# Patient Record
Sex: Female | Born: 1970 | Hispanic: No | Marital: Single | Smoking: Current every day smoker
Health system: Southern US, Community
[De-identification: ages and names within clinical notes are randomized; demographics above are authoritative.]

## PROBLEM LIST (undated history)

## (undated) DIAGNOSIS — Z720 Tobacco use: Secondary | ICD-10-CM

## (undated) DIAGNOSIS — D649 Anemia, unspecified: Secondary | ICD-10-CM

## (undated) DIAGNOSIS — K589 Irritable bowel syndrome without diarrhea: Secondary | ICD-10-CM

## (undated) DIAGNOSIS — M549 Dorsalgia, unspecified: Secondary | ICD-10-CM

## (undated) DIAGNOSIS — A048 Other specified bacterial intestinal infections: Secondary | ICD-10-CM

## (undated) DIAGNOSIS — K219 Gastro-esophageal reflux disease without esophagitis: Secondary | ICD-10-CM

## (undated) HISTORY — DX: Gastro-esophageal reflux disease without esophagitis: K21.9

## (undated) HISTORY — DX: Anemia, unspecified: D64.9

## (undated) HISTORY — DX: Tobacco use: Z72.0

## (undated) HISTORY — DX: Dorsalgia, unspecified: M54.9

## (undated) HISTORY — DX: Other specified bacterial intestinal infections: A04.8

## (undated) HISTORY — DX: Irritable bowel syndrome without diarrhea: K58.9

---

## 1996-06-21 HISTORY — PX: TUBAL LIGATION: SHX77

## 2000-10-06 ENCOUNTER — Emergency Department (HOSPITAL_COMMUNITY): Admission: EM | Admit: 2000-10-06 | Discharge: 2000-10-06 | Payer: Self-pay | Admitting: Emergency Medicine

## 2001-05-31 ENCOUNTER — Emergency Department (HOSPITAL_COMMUNITY): Admission: EM | Admit: 2001-05-31 | Discharge: 2001-05-31 | Payer: Self-pay | Admitting: Emergency Medicine

## 2001-06-01 ENCOUNTER — Emergency Department (HOSPITAL_COMMUNITY): Admission: EM | Admit: 2001-06-01 | Discharge: 2001-06-01 | Payer: Self-pay | Admitting: Emergency Medicine

## 2001-10-04 ENCOUNTER — Encounter: Payer: Self-pay | Admitting: Emergency Medicine

## 2001-10-05 ENCOUNTER — Inpatient Hospital Stay (HOSPITAL_COMMUNITY): Admission: EM | Admit: 2001-10-05 | Discharge: 2001-10-06 | Payer: Self-pay | Admitting: Emergency Medicine

## 2002-06-21 DIAGNOSIS — Z720 Tobacco use: Secondary | ICD-10-CM

## 2002-06-21 HISTORY — DX: Tobacco use: Z72.0

## 2003-07-11 ENCOUNTER — Emergency Department (HOSPITAL_COMMUNITY): Admission: EM | Admit: 2003-07-11 | Discharge: 2003-07-11 | Payer: Self-pay | Admitting: Emergency Medicine

## 2003-11-15 ENCOUNTER — Emergency Department (HOSPITAL_COMMUNITY): Admission: EM | Admit: 2003-11-15 | Discharge: 2003-11-16 | Payer: Self-pay | Admitting: Emergency Medicine

## 2007-06-22 DIAGNOSIS — K589 Irritable bowel syndrome without diarrhea: Secondary | ICD-10-CM

## 2007-06-22 HISTORY — DX: Irritable bowel syndrome, unspecified: K58.9

## 2010-07-13 ENCOUNTER — Encounter: Payer: Self-pay | Admitting: Gastroenterology

## 2011-09-24 ENCOUNTER — Encounter: Payer: Self-pay | Admitting: Obstetrics and Gynecology

## 2011-10-18 ENCOUNTER — Encounter: Payer: Self-pay | Admitting: Obstetrics and Gynecology

## 2012-01-19 ENCOUNTER — Other Ambulatory Visit: Payer: Self-pay | Admitting: Obstetrics and Gynecology

## 2012-01-19 DIAGNOSIS — R928 Other abnormal and inconclusive findings on diagnostic imaging of breast: Secondary | ICD-10-CM

## 2012-01-27 ENCOUNTER — Ambulatory Visit
Admission: RE | Admit: 2012-01-27 | Discharge: 2012-01-27 | Disposition: A | Discharge status: Home / Self Care | Payer: 59 | Source: Ambulatory Visit | Attending: Obstetrics and Gynecology | Admitting: Obstetrics and Gynecology

## 2012-01-27 DIAGNOSIS — R928 Other abnormal and inconclusive findings on diagnostic imaging of breast: Secondary | ICD-10-CM

## 2013-06-28 IMAGING — US US BREAST*L*
1 series · 2 of 2 positions shown · non-contrast
Comparison: Baseline screening mammogram 01/14/2012

CLINICAL DATA: Asymmetry left breast identified on recent
screening mammogram.

DIGITAL DIAGNOSTIC LEFT MAMMOGRAM WITHOUT CAD AND LOW BREAST
ULTRASOUND:

[Series 1: us breast*left* · 2 of 2 slices shown]
[im 1/2]
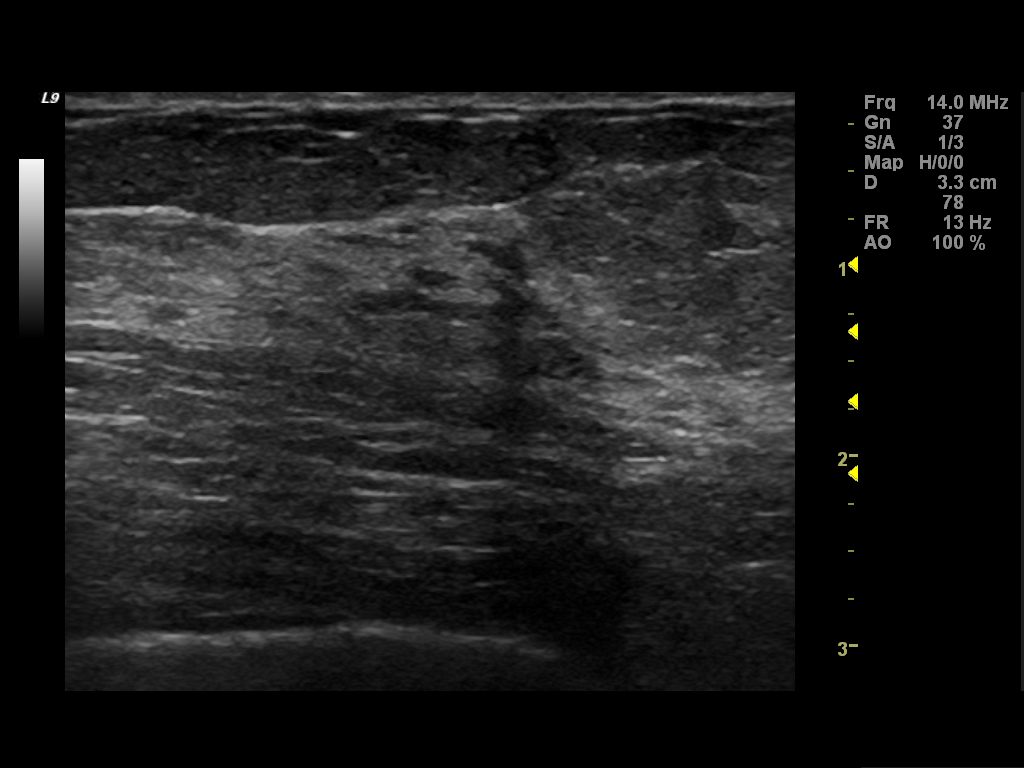
[im 2/2]
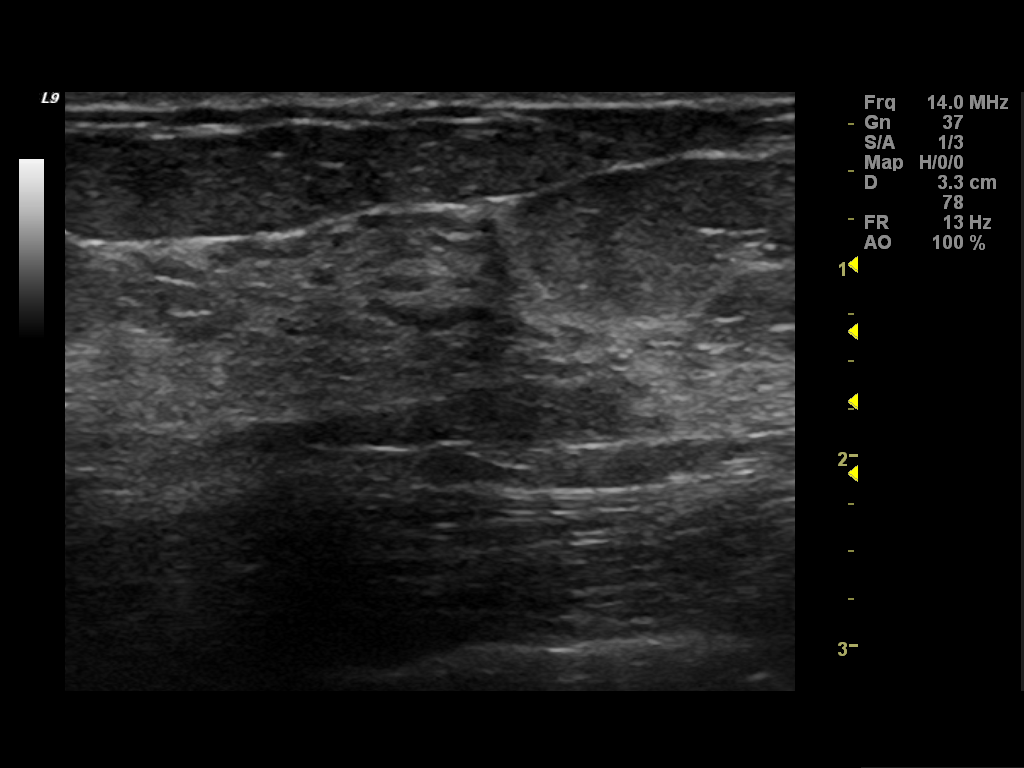

[2 of 2 positions shown; findings below may reference images not displayed]

FINDINGS: Focal spot compression view of the medial left breast
shows a persistent asymmetry, which partially disperses with
compression.  Focal spot compression view of the upper left breast
shows dense breast parenchyma without evidence of mass or
distortion.  90 degrees lateral view of the left breast shows dense
breast parenchyma predominately the upper left breast.

On physical exam, no mass is palpated in the left breast.

Ultrasound is performed, showing focal island of normal
fibroglandular breast tissue in the [DATE] position 7 o'clock 7 cm
from the nipple.  This may account for the focal asymmetry on
mammography.  No solid or cystic mass is identified on ultrasound.
IMPRESSION: Probable island of asymmetric fibroglandular breast tissue in the
medial left breast.

RECOMMENDATION:
Diagnostic left mammogram in 6 months is suggested to evaluate for
stability of the probably benign findings in the left breast.

BI-RADS CATEGORY 3:  Probably benign finding(s) - short interval
follow-up suggested.

## 2016-11-01 ENCOUNTER — Ambulatory Visit (INDEPENDENT_AMBULATORY_CARE_PROVIDER_SITE_OTHER): Payer: BLUE CROSS/BLUE SHIELD | Admitting: Otolaryngology

## 2016-11-01 DIAGNOSIS — R490 Dysphonia: Secondary | ICD-10-CM | POA: Diagnosis not present

## 2016-11-01 DIAGNOSIS — R070 Pain in throat: Secondary | ICD-10-CM | POA: Diagnosis not present

## 2016-12-06 ENCOUNTER — Ambulatory Visit (HOSPITAL_COMMUNITY)
Admission: EM | Admit: 2016-12-06 | Discharge: 2016-12-06 | Disposition: A | Discharge status: Home / Self Care | Payer: BLUE CROSS/BLUE SHIELD | Attending: Family Medicine | Admitting: Family Medicine

## 2016-12-06 ENCOUNTER — Ambulatory Visit (INDEPENDENT_AMBULATORY_CARE_PROVIDER_SITE_OTHER): Payer: BLUE CROSS/BLUE SHIELD

## 2016-12-06 ENCOUNTER — Encounter (HOSPITAL_COMMUNITY): Payer: Self-pay | Admitting: Emergency Medicine

## 2016-12-06 ENCOUNTER — Ambulatory Visit (HOSPITAL_COMMUNITY): Payer: BLUE CROSS/BLUE SHIELD

## 2016-12-06 DIAGNOSIS — T704XXA Effects of high-pressure fluids, initial encounter: Secondary | ICD-10-CM | POA: Diagnosis not present

## 2016-12-06 DIAGNOSIS — S91301A Unspecified open wound, right foot, initial encounter: Secondary | ICD-10-CM | POA: Diagnosis not present

## 2016-12-06 DIAGNOSIS — L089 Local infection of the skin and subcutaneous tissue, unspecified: Secondary | ICD-10-CM

## 2016-12-06 DIAGNOSIS — F17210 Nicotine dependence, cigarettes, uncomplicated: Secondary | ICD-10-CM | POA: Diagnosis not present

## 2016-12-06 DIAGNOSIS — T148XXA Other injury of unspecified body region, initial encounter: Secondary | ICD-10-CM | POA: Diagnosis not present

## 2016-12-06 DIAGNOSIS — M7989 Other specified soft tissue disorders: Secondary | ICD-10-CM | POA: Diagnosis not present

## 2016-12-06 DIAGNOSIS — L0889 Other specified local infections of the skin and subcutaneous tissue: Secondary | ICD-10-CM | POA: Diagnosis present

## 2016-12-06 LAB — POCT I-STAT, CHEM 8
BUN: 11 mg/dL (ref 6–20)
CHLORIDE: 100 mmol/L — AB (ref 101–111)
Calcium, Ion: 1.06 mmol/L — ABNORMAL LOW (ref 1.15–1.40)
Creatinine, Ser: 0.8 mg/dL (ref 0.44–1.00)
Glucose, Bld: 90 mg/dL (ref 65–99)
HEMATOCRIT: 43 % (ref 36.0–46.0)
Hemoglobin: 14.6 g/dL (ref 12.0–15.0)
POTASSIUM: 3.7 mmol/L (ref 3.5–5.1)
SODIUM: 135 mmol/L (ref 135–145)
TCO2: 26 mmol/L (ref 0–100)

## 2016-12-06 LAB — CBC WITH DIFFERENTIAL/PLATELET
Basophils Absolute: 0 10*3/uL (ref 0.0–0.1)
Basophils Relative: 1 %
Eosinophils Absolute: 0 10*3/uL (ref 0.0–0.7)
Eosinophils Relative: 0 %
HEMATOCRIT: 40.6 % (ref 36.0–46.0)
HEMOGLOBIN: 13.8 g/dL (ref 12.0–15.0)
LYMPHS ABS: 1.5 10*3/uL (ref 0.7–4.0)
Lymphocytes Relative: 26 %
MCH: 32.1 pg (ref 26.0–34.0)
MCHC: 34 g/dL (ref 30.0–36.0)
MCV: 94.4 fL (ref 78.0–100.0)
MONOS PCT: 13 %
Monocytes Absolute: 0.7 10*3/uL (ref 0.1–1.0)
Neutro Abs: 3.3 10*3/uL (ref 1.7–7.7)
Neutrophils Relative %: 60 %
Platelets: 218 10*3/uL (ref 150–400)
RBC: 4.3 MIL/uL (ref 3.87–5.11)
RDW: 12.8 % (ref 11.5–15.5)
WBC: 5.6 10*3/uL (ref 4.0–10.5)

## 2016-12-06 MED ORDER — HYDROCODONE-ACETAMINOPHEN 5-325 MG PO TABS: 1.0000 | ORAL_TABLET | Freq: Four times a day (QID) | ORAL | 0 refills | Status: DC | PRN

## 2016-12-06 MED ORDER — CEFTRIAXONE SODIUM 1 G IJ SOLR
INTRAMUSCULAR | Status: AC
  Filled 2016-12-06: qty 10

## 2016-12-06 MED ORDER — LIDOCAINE HCL (PF) 1 % IJ SOLN
INTRAMUSCULAR | Status: AC
  Filled 2016-12-06: qty 2

## 2016-12-06 MED ORDER — DOXYCYCLINE HYCLATE 100 MG PO CAPS: 100.0000 mg | ORAL_CAPSULE | Freq: Two times a day (BID) | ORAL | 0 refills | Status: DC

## 2016-12-06 MED ORDER — CEFTRIAXONE SODIUM 1 G IJ SOLR
1.0000 g | Freq: Once | INTRAMUSCULAR | Status: AC
Start: 2016-12-06 — End: 2016-12-06
  Administered 2016-12-06: 1 g via INTRAMUSCULAR

## 2016-12-06 NOTE — ED Triage Notes (Signed)
Pt c/o wound/skin infection to right foot onset 3 days  Sts she was pressure washing her deck and accidentally pressured washed her foot  Sx today include redness that is spreading, tenderness  Denies fevers, chills  A&O x4... NAD... Ambulatory

## 2016-12-06 NOTE — Discharge Instructions (Signed)
Your wound has been cultured, your x-rays are clear, blood work is been sent to the lab for further testing. You have been given injection of ceftriaxone here in the clinic. If you have no improvement in your symptoms, return to clinic in one week, if at any time your symptoms worsen, go to the emergency room. I started on doxycycline, take one tablet twice a day. I have also prescribed a medicine for pain called hydrocodone, this medicine is a narcotic, it will cause drowsiness, and it is addictive. Do not take more than what is necessary, do not drink alcohol while taking, and do not operate any heavy machinery while taking this medicine.

## 2016-12-06 NOTE — ED Provider Notes (Signed)
CSN: [id number]     Arrival date & time 12/06/16  1505 History   First MD Initiated Contact with Patient 12/06/16 1528     Chief Complaint  Patient presents with  . Wound Infection   (Consider location/radiation/quality/duration/timing/severity/associated sxs/prior Treatment) Elizabeth Hodge is a 46 y.o. female who presents to the [facility] urgent care with a chief complaint of infected wound to the right foot. States she was pressure washing on Saturday, when she lost control of the nozzle causing it to sweep across her foot. States that over the last 24 hours it has increased swelling, is been painful to walk on, she is concerned about infection. She is able to bear weight, however it is painful. She is not a diabetic, no history of vascular disease, or compromised circulation. She is a smoker, occasional alcohol use, but other social history is not pertinent.   The history is provided by the patient.    History reviewed. No pertinent past medical history. History reviewed. No pertinent surgical history. History reviewed. No pertinent family history. Social History  Substance Use Topics  . Smoking status: Current Every Day Smoker    Types: Cigarettes  . Smokeless tobacco: Never Used  . Alcohol use Yes   OB History    No data available     Review of Systems  Constitutional: Negative.   HENT: Negative.   Respiratory: Negative.   Cardiovascular: Negative.   Gastrointestinal: Negative.   Musculoskeletal: Positive for joint swelling.  Skin: Positive for wound.  Neurological: Negative.     Allergies  Patient has no known allergies.  Home Medications   Prior to Admission medications   Medication Sig Start Date End Date Taking? Authorizing Provider  acetaminophen-codeine (TYLENOL #3) 300-30 MG tablet Take by mouth every 4 (four) hours as needed for moderate pain.   Yes [provider]  meloxicam (MOBIC) 15 MG tablet Take 15 mg by mouth daily.   Yes [provider]  omeprazole (PRILOSEC) 20 MG capsule Take 20 mg by mouth daily.   Yes [provider]  doxycycline (VIBRAMYCIN) 100 MG capsule Take 1 capsule (100 mg total) by mouth 2 (two) times daily. 12/06/16   Barnet Glasgow, NP  HYDROcodone-acetaminophen (NORCO/VICODIN) 5-325 MG tablet Take 1 tablet by mouth every 6 (six) hours as needed. 12/06/16   Barnet Glasgow, NP   Meds Ordered and Administered this Visit   Medications  cefTRIAXone (ROCEPHIN) injection 1 g (1 g Intramuscular Given 12/06/16 1620)    BP 127/85 (BP Location: Left Arm)   Pulse 99   Temp 98 F (36.7 C) (Oral)   Resp 16   LMP 11/29/2016   SpO2 100%  No data found.   Physical Exam  Constitutional: She is oriented to person, place, and time. She appears well-developed and well-nourished. No distress.  HENT:  Head: Normocephalic and atraumatic.  Right Ear: External ear normal.  Left Ear: External ear normal.  Eyes: Conjunctivae are normal.  Neck: Normal range of motion.  Musculoskeletal: She exhibits edema and tenderness.  See attached photo  Neurological: She is alert and oriented to person, place, and time.  Skin: Skin is warm and dry. Capillary refill takes less than 2 seconds. No rash noted. She is not diaphoretic. No erythema.  Psychiatric: She has a normal mood and affect. Her behavior is normal.  Nursing note and vitals reviewed.     Urgent Care Course     Procedures (including critical care time)  Labs Review Labs  Reviewed  POCT I-STAT, CHEM 8 - Abnormal; Notable for the following:       Result Value   Chloride 100 (*)    Calcium, Ion 1.06 (*)    All other components within normal limits  AEROBIC CULTURE (SUPERFICIAL SPECIMEN)  CBC WITH DIFFERENTIAL/PLATELET    Imaging Review Dg Ankle Complete Right  Result Date: 12/06/2016 CLINICAL DATA:  Soft tissue injury anterior to the medial malleolus with a pressure washer 2 days ago. EXAM: RIGHT ANKLE - COMPLETE 3+ VIEW  COMPARISON:  None. FINDINGS: There is soft tissue swelling over the lateral malleolus but there is no radiodense foreign body in the soft tissues. The bones appear normal. IMPRESSION: Nonspecific soft tissue swelling.  Otherwise, normal exam. Electronically Signed   By: Lorriane Shire M.D.   On: 12/06/2016 16:20       MDM   1. Wound infection    Elizabeth Hodge is a 46 y.o. female who presents to the [facility] urgent care with a chief complaint of infected wound to the right foot. States she was pressure washing on Saturday, when she lost control of the nozzle causing it to sweep across her foot. States that over the last 24 hours it has increased swelling, is been painful to walk on, she is concerned about infection. She is able to bear weight, however it is painful. She is not a diabetic, no history of vascular disease, or compromised circulation. She is a smoker, occasional alcohol use, but other social history is not pertinent.   X-rays are unremarkable, no foreign bodies, no evidence of osteomyelitis. Was given injection of ceftriaxone in clinic, cultures obtained, CBC with differential, along with i-STAT. Discharged home with strict follow-up instructions, also prescribed doxycycline, hydrocodone for pain, recommend following up with community health and wellness to establish for primary care, return to clinic as necessary.  [place] controlled substances reporting system consulted prior to issuing prescription, no prescriptions for controlled substances issued in the last 6 months.       Barnet Glasgow, NP 12/06/16 [phone]

## 2016-12-08 NOTE — Progress Notes (Signed)
HPI:  Elizabeth Hodge is a 46 y.o. year old female who presents today for a new patient appointment to establish general primary care, also to discuss  Chief Complaint  Patient presents with  . New Patient (Initial Visit)    1. Left sided chest pain: Patient complains of left chest pain that started last week after she was pressure washing all day. Onset was 1 week ago, with unchanged course since that time. The patient describes the pain as intermittent, costochondral in nature, does not radiate. Associated symptoms are none. Aggravating factors are deep inspiration and palpation of chest.  Alleviating factors are: tylenol 3 or hydrocodone. Patient's cardiac risk factors are smoking/ tobacco exposure.  Patient's risk factors for DVT/PE: none. Denies any shortness of breath.   2. ED follow up for right leg injury: Notes that she pressure washing on 6/15 and the hose went over her right foot and then went to the urgent care on 6/18 because it was really painful. She was given a prescription for doxycycline, Notes that she took 2 pills of doxycycline. She did not tolerate this medication and threw it up. Denies any fever, chills, nausea, vomiting, diarrhea. Does note that it is itchy. No pus. Only took 2 hydrocodones so far for pain.   3. GERD: Unresolved symptoms with 20 mg Omeprazole. Notes heartburn occurs at night and has some throat pain in the morning. Was previously seeing an ENT for this.   ROS: See HPI  Past Medical Hx:  Past Medical History:  Diagnosis Date  . Back pain    has bulging disc in lumbar area, follows with orthopedic   . GERD (gastroesophageal reflux disease)   . Tobacco abuse 2004    Past Surgical Hx:  Past Surgical History:  Procedure Laterality Date  . TUBAL LIGATION  1998    Family Hx: updated in Epic  Social Hx:  Social History   Social History  . Marital status: Single    Spouse name: N/A  . Number of children: N/A  . Years of education: N/A     Occupational History  . Not on file.   Social History Main Topics  . Smoking status: Current Every Day Smoker    Types: Cigarettes    Start date: 12/10/2002  . Smokeless tobacco: Never Used  . Alcohol use Yes     Comment: Drinks a couple glasses of wine on the weekends  . Drug use: No  . Sexual activity: Yes    Partners: Male    Birth control/ protection: Surgical   Other Topics Concern  . Not on file   Social History Narrative  . No narrative on file    Health Maintenance:  Health Maintenance Due  Topic Date Due  . HIV Screening  03/17/1988  . TETANUS/TDAP  03/17/1992  . PAP SMEAR  03/17/1994    PHYSICAL EXAM: BP 98/64   Pulse 74   Temp 97.9 F (36.6 C) (Oral)   Ht 5' 5.5" (1.664 m)   Wt 145 lb 9.6 oz (66 kg)   LMP 11/29/2016   SpO2 98%   BMI 23.86 kg/m  General: In NAD, well developed, well nourished Cardiovascular: RRR, normal s1 and s2, no rubs, gallops, or murmurs Chest: Tenderness to deep palpation of left upper chest. No breast lumps or pain appreciated  Respiratory: no increased work of breathing, clear to auscultation bilaterally Abdomen: soft, non-tender,non-distended, +BS, stretch marks on abdomen Extremities: no edema.  Skin: Right medial ankle showing ~3-4  cm area of scabbing with slight surrounding erythema (see picture), no underlying fluctuance or induration or increased warmth MSK: normal ROM  Neuro: A&O x3, no gross motor defecits  Psych: appropriate mood and affect, rapid speech     ASSESSMENT/PLAN:  Health maintenance:  - Normal BMP and CBC on 12/06/16. Up to date per patient with normal mammogram last year and normal pap smear last year at [facility]. Will obtain records. Consent form filled out.   GERD (gastroesophageal reflux disease) Uncontrolled. -Increase omeprazole to 40 mg daily at bedtime -If symptoms persist patient told that she can take omeprazole  twice a da -If persistently symptomatic, patient instructed  to come back to the clinic for an appointment  Wound of ankle Right ankle wound. Status post injury with pressure washer about a week ago. Only took 2 doses of doxycycline that was prescribed at urgent care and stopped this due to GI issues. Compared to previous picture in Epic, wound appears to be healing appropriately. Wound culture was negative 3 days. Exam shows some tenderness in the area still but no signs of cellulitis or abscess. -Discussed with patient that we can hold off on PO antibiotics for now and keep a close eye on her ankle -Patient will apply triple antibiotic ointment daily until it is fully healed -Strict return precautions discussed   Chest pain New problem that started after a day of pressure washing her porch. Unlikely of cardiac etiology based on history and exam. More likely musculoskeletal related. Possibly precordial catch versus pectoralis muscle strain.  -Ibuprofen 400 mg twice a day 7 days, then when necessary -Patient will return to office if symptoms worsen or fail to improve as anticipated   Tobacco abuse counseling Smoking about 6 cigarettes a day. Not ready to quit at this time. Discussed risks of cigarette smoking.   Smitty Cords, MD PGY-2 [place]

## 2016-12-09 ENCOUNTER — Encounter: Payer: Self-pay | Admitting: Family Medicine

## 2016-12-09 ENCOUNTER — Ambulatory Visit (INDEPENDENT_AMBULATORY_CARE_PROVIDER_SITE_OTHER): Payer: BLUE CROSS/BLUE SHIELD | Admitting: Family Medicine

## 2016-12-09 VITALS — BP 98/64 | HR 74 | Temp 97.9°F | Ht 65.5 in | Wt 145.6 lb

## 2016-12-09 DIAGNOSIS — S91001A Unspecified open wound, right ankle, initial encounter: Secondary | ICD-10-CM

## 2016-12-09 DIAGNOSIS — Z716 Tobacco abuse counseling: Secondary | ICD-10-CM | POA: Diagnosis not present

## 2016-12-09 DIAGNOSIS — Z7689 Persons encountering health services in other specified circumstances: Secondary | ICD-10-CM | POA: Diagnosis not present

## 2016-12-09 DIAGNOSIS — R079 Chest pain, unspecified: Secondary | ICD-10-CM | POA: Insufficient documentation

## 2016-12-09 DIAGNOSIS — R072 Precordial pain: Secondary | ICD-10-CM

## 2016-12-09 DIAGNOSIS — S91009A Unspecified open wound, unspecified ankle, initial encounter: Secondary | ICD-10-CM | POA: Insufficient documentation

## 2016-12-09 DIAGNOSIS — K219 Gastro-esophageal reflux disease without esophagitis: Secondary | ICD-10-CM | POA: Diagnosis not present

## 2016-12-09 LAB — AEROBIC CULTURE W GRAM STAIN (SUPERFICIAL SPECIMEN)
Culture: NO GROWTH
Gram Stain: NONE SEEN

## 2016-12-09 LAB — AEROBIC CULTURE  (SUPERFICIAL SPECIMEN)

## 2016-12-09 MED ORDER — OMEPRAZOLE 40 MG PO CPDR: 40.0000 mg | DELAYED_RELEASE_CAPSULE | Freq: Every day | ORAL | 3 refills | Status: DC

## 2016-12-09 NOTE — Assessment & Plan Note (Signed)
Uncontrolled. -Increase omeprazole to 40 mg daily at bedtime -If symptoms persist patient told that she can take omeprazole  twice a da -If persistently symptomatic, patient instructed to come back to the clinic for an appointment

## 2016-12-09 NOTE — Assessment & Plan Note (Addendum)
Right ankle wound. Status post injury with pressure washer about a week ago. Only took 2 doses of doxycycline that was prescribed at urgent care and stopped this due to GI issues. Compared to previous picture in Epic, wound appears to be healing appropriately. Wound culture was negative 3 days. Exam shows some tenderness in the area still but no signs of cellulitis or abscess. -Discussed with patient that we can hold off on PO antibiotics for now and keep a close eye on her ankle -Patient will apply triple antibiotic ointment daily until it is fully healed -Strict return precautions discussed

## 2016-12-09 NOTE — Patient Instructions (Signed)
Thank you for coming in today, it was so nice to see you! Today we talked about:    Leg wound: Your wound looks like it is healing. Please come back if your ankle is more painful, looks more red, there is pus coming out, if you have a fever, or there are streaks of red lines coming up her leg.  Heart burn; we have increased your heartburn medication to 40 mg of omeprazole daily, take this before you go to bed. If this does not improve your symptoms over the next week, take this pill in the morning and at night.  Chest pain: Take 400 mg of ibuprofen twice a day for the next week. If the pain does not go away, please come back and see me  Please follow up as needed  If you have any questions or concerns, please do not hesitate to call the office at [phone]. You can also message me directly via MyChart.   Sincerely,  Smitty Cords, MD

## 2016-12-09 NOTE — Assessment & Plan Note (Signed)
Smoking about 6 cigarettes a day. Not ready to quit at this time. Discussed risks of cigarette smoking.

## 2016-12-09 NOTE — Assessment & Plan Note (Signed)
New problem that started after a day of pressure washing her porch. Unlikely of cardiac etiology based on history and exam. More likely musculoskeletal related. Possibly precordial catch versus pectoralis muscle strain.  -Ibuprofen 400 mg twice a day 7 days, then when necessary -Patient will return to office if symptoms worsen or fail to improve as anticipated

## 2016-12-13 ENCOUNTER — Ambulatory Visit (INDEPENDENT_AMBULATORY_CARE_PROVIDER_SITE_OTHER): Payer: BLUE CROSS/BLUE SHIELD | Admitting: Otolaryngology

## 2017-03-07 ENCOUNTER — Other Ambulatory Visit: Payer: Self-pay | Admitting: Obstetrics and Gynecology

## 2017-03-07 DIAGNOSIS — R928 Other abnormal and inconclusive findings on diagnostic imaging of breast: Secondary | ICD-10-CM

## 2017-03-11 ENCOUNTER — Ambulatory Visit
Admission: RE | Admit: 2017-03-11 | Discharge: 2017-03-11 | Disposition: A | Discharge status: Home / Self Care | Payer: BLUE CROSS/BLUE SHIELD | Source: Ambulatory Visit | Attending: Obstetrics and Gynecology | Admitting: Obstetrics and Gynecology

## 2017-03-11 ENCOUNTER — Ambulatory Visit: Payer: BLUE CROSS/BLUE SHIELD

## 2017-03-11 DIAGNOSIS — R928 Other abnormal and inconclusive findings on diagnostic imaging of breast: Secondary | ICD-10-CM

## 2017-04-06 ENCOUNTER — Other Ambulatory Visit: Payer: Self-pay | Admitting: Family Medicine

## 2017-07-16 ENCOUNTER — Other Ambulatory Visit: Payer: Self-pay | Admitting: Internal Medicine

## 2017-10-14 ENCOUNTER — Other Ambulatory Visit: Payer: Self-pay

## 2017-10-14 ENCOUNTER — Ambulatory Visit: Payer: BLUE CROSS/BLUE SHIELD | Admitting: Family Medicine

## 2017-10-14 ENCOUNTER — Encounter: Payer: Self-pay | Admitting: Family Medicine

## 2017-10-14 VITALS — Wt 163.0 lb

## 2017-10-14 DIAGNOSIS — N390 Urinary tract infection, site not specified: Secondary | ICD-10-CM

## 2017-10-14 DIAGNOSIS — N898 Other specified noninflammatory disorders of vagina: Secondary | ICD-10-CM

## 2017-10-14 DIAGNOSIS — M549 Dorsalgia, unspecified: Secondary | ICD-10-CM | POA: Diagnosis not present

## 2017-10-14 LAB — POCT WET PREP (WET MOUNT)
Clue Cells Wet Prep Whiff POC: NEGATIVE
TRICHOMONAS WET PREP HPF POC: ABSENT

## 2017-10-14 LAB — POCT URINALYSIS DIP (MANUAL ENTRY)
BILIRUBIN UA: NEGATIVE mg/dL
Bilirubin, UA: NEGATIVE
Glucose, UA: NEGATIVE mg/dL
Nitrite, UA: POSITIVE — AB
Spec Grav, UA: 1.015 (ref 1.010–1.025)
Urobilinogen, UA: 0.2 E.U./dL
pH, UA: 7.5 (ref 5.0–8.0)

## 2017-10-14 LAB — POCT UA - MICROSCOPIC ONLY

## 2017-10-14 MED ORDER — CEPHALEXIN 500 MG PO CAPS: 500.0000 mg | ORAL_CAPSULE | Freq: Two times a day (BID) | ORAL | 0 refills | Status: AC

## 2017-10-14 NOTE — Progress Notes (Signed)
   Subjective:    Elizabeth Hodge - 47 y.o. female MRN [id number]  Date of birth: 08-Mar-1971  HPI  Elizabeth Hodge is here for concern for UTI or vaginal infection after being in a hot tub the other day and noticing that the water was green later that evening.  She notes that her brother had just been in the hot tub after he had used CBD cream on his skin, so she is worried that CBD exposure may be causing her symptoms.  She reports dysuria and urinary frequency.  She denies changes in her vaginal discharge or vaginal itching.  She has had no change in her sexual partners and is not worried about having an STI.  She denies subjective fevers.  This feels like previous UTIs to her.  Health Maintenance:  Health Maintenance Due  Topic Date Due  . HIV Screening  03/17/1988  . TETANUS/TDAP  03/17/1992  . PAP SMEAR  03/17/1994    -  reports that she has been smoking cigarettes.  She started smoking about 14 years ago. She has never used smokeless tobacco. - Review of Systems: Per HPI. - Past Medical History: Patient Active Problem List   Diagnosis Date Noted  . Urinary tract infection without hematuria 10/16/2017  . GERD (gastroesophageal reflux disease) 12/09/2016  . Wound of ankle 12/09/2016  . Chest pain 12/09/2016  . Tobacco abuse counseling 12/09/2016   - Medications: reviewed and updated   Objective:   Physical Exam Wt 163 lb (73.9 kg)   LMP 10/03/2017 (Approximate)   BMI 26.71 kg/m  Gen: NAD, alert, cooperative with exam, well-appearing HEENT: NCAT, clear conjunctiva, supple neck CV: RRR, good S1/S2, no murmur, no edema Resp: CTABL, no wheezes, non-labored Abd: tender in suprapubic region with bilateral CVA tenderness, soft Skin: no rashes, normal turgor  Neuro: no gross deficits.   Assessment & Plan:   Urinary tract infection without hematuria UA is positive for UTI, with nitrites and small LE and Hgb.  Wet prep self-swab negative.  Concern for pyelonephritis is  low since patient does not have systemic symptoms.  Will prescribe Keflex 500 mg BID x 7 days.  Return precautions were given.    Maia Breslow, M.D. 10/16/2017, 10:26 AM PGY-1, [place]

## 2017-10-14 NOTE — Patient Instructions (Signed)
It was nice meeting you today Ms. Quintela!  Today, we did an analysis of your urine and an analysis of your vaginal discharge to see if you had an infection.  Your urinalysis was positive for a UTI, so I am prescribing Keflex 500 mg twice per day for 7 days to treat this infection.  You do not have any infection in your vaginal discharge.  If you have any questions or concerns, please feel free to call the clinic.   Be well,  Dr. Shan Levans

## 2017-10-16 ENCOUNTER — Encounter: Payer: Self-pay | Admitting: Family Medicine

## 2017-10-16 DIAGNOSIS — N390 Urinary tract infection, site not specified: Secondary | ICD-10-CM | POA: Insufficient documentation

## 2017-10-16 NOTE — Assessment & Plan Note (Addendum)
UA is positive for UTI, with nitrites and small LE and Hgb.  Wet prep self-swab negative.  Concern for pyelonephritis is low since patient does not have systemic symptoms.  Will prescribe Keflex 500 mg BID x 7 days.  Return precautions were given.

## 2017-11-01 ENCOUNTER — Encounter: Payer: Self-pay | Admitting: Family Medicine

## 2017-11-01 ENCOUNTER — Other Ambulatory Visit (HOSPITAL_COMMUNITY)
Admission: RE | Admit: 2017-11-01 | Discharge: 2017-11-01 | Disposition: A | Discharge status: Home / Self Care | Payer: BLUE CROSS/BLUE SHIELD | Source: Ambulatory Visit | Attending: Family Medicine | Admitting: Family Medicine

## 2017-11-01 ENCOUNTER — Other Ambulatory Visit: Payer: Self-pay

## 2017-11-01 ENCOUNTER — Ambulatory Visit: Payer: BLUE CROSS/BLUE SHIELD | Admitting: Family Medicine

## 2017-11-01 VITALS — BP 144/86 | HR 90 | Temp 98.3°F | Ht 65.5 in | Wt 166.0 lb

## 2017-11-01 DIAGNOSIS — K921 Melena: Secondary | ICD-10-CM | POA: Diagnosis not present

## 2017-11-01 DIAGNOSIS — Z113 Encounter for screening for infections with a predominantly sexual mode of transmission: Secondary | ICD-10-CM | POA: Diagnosis not present

## 2017-11-01 DIAGNOSIS — R300 Dysuria: Secondary | ICD-10-CM | POA: Diagnosis not present

## 2017-11-01 DIAGNOSIS — R1031 Right lower quadrant pain: Secondary | ICD-10-CM

## 2017-11-01 LAB — POCT URINALYSIS DIP (MANUAL ENTRY)
BILIRUBIN UA: NEGATIVE
BILIRUBIN UA: NEGATIVE mg/dL
GLUCOSE UA: NEGATIVE mg/dL
LEUKOCYTES UA: NEGATIVE
NITRITE UA: NEGATIVE
Protein Ur, POC: NEGATIVE mg/dL
Spec Grav, UA: 1.01 (ref 1.010–1.025)
UROBILINOGEN UA: 0.2 U/dL
pH, UA: 7.5 (ref 5.0–8.0)

## 2017-11-01 LAB — POCT WET PREP (WET MOUNT)
CLUE CELLS WET PREP WHIFF POC: NEGATIVE
TRICHOMONAS WET PREP HPF POC: ABSENT

## 2017-11-01 LAB — POCT UA - MICROSCOPIC ONLY

## 2017-11-01 LAB — POCT URINE PREGNANCY: Preg Test, Ur: NEGATIVE

## 2017-11-01 LAB — POCT HEMOGLOBIN: HEMOGLOBIN: 10.3 g/dL — AB (ref 12.2–16.2)

## 2017-11-01 NOTE — Patient Instructions (Signed)
Thank you for coming in today, it was so nice to see you! Today we talked about:    Abdominal Pain: Sorry that you are having abdominal pain.  We checked your urine and you no longer have an infection.  We checked your vagina and cervix today for any infection, it will take about 24 to 48 hours to get these results back.  I will call you if anything is not normal otherwise she will get a letter in the mail.  Because of your blood in your poop and your abdominal pain I have sent in a referral for GI.  Someone should call you within the next 1 to 2 weeks to get this done.  If you do not hear from anybody within the next 2 weeks please call the clinic and let me know.  Reasons to go to the hospital would be if your pain suddenly gets worse, you have nausea or vomiting, fever, or more blood coming from your rectum  If we ordered any tests today, you will be notified via telephone of any abnormalities. If everything is normal you will get a letter in the mail.   If you have any questions or concerns, please do not hesitate to call the office at [phone]. You can also message me directly via MyChart.   Sincerely,  Smitty Cords, MD

## 2017-11-01 NOTE — Progress Notes (Addendum)
Subjective:    Patient ID: Elizabeth Hodge , female   DOB: [birth year] , 47 y.o..   MRN: [id number]  HPI  Elizabeth Hodge is here for  Chief Complaint  Patient presents with  . Urinary Tract Infection    DYSURIA and RLQ Abdominal Pain  Pain urinating started about 2-1/2 weeks ago. Improved after 7 course of Keflex. Patient notes that since being diagnosed with a UTI she has also had constant RLQ abdominal pain.  She notes that the pain sometimes radiates to her suprapubic region.  She is wondering if this is related to her chronic low back pain that she was supposed to have surgery for.  She notes that she has a bowel movement at least every other day and does have intermittent constipation that she takes stool softeners for.  Does endorse hematochezia when wiping after stooling.  Notes that she has "black tarry stools" about 2-3 times a month. Last menstrual period was 5 days ago, still on her period  Denies any more dysuria, urinary frequency, nausea, vomiting, diarrhea, fever, vaginal discharge, anorexia  Review of Symptoms - see HPI PMH - Smoking status noted.    Past Medical History: Patient Active Problem List   Diagnosis Date Noted  . Urinary tract infection without hematuria 10/16/2017  . GERD (gastroesophageal reflux disease) 12/09/2016  . Tobacco abuse counseling 12/09/2016    Medications: reviewed and updated Current Outpatient Medications  Medication Sig Dispense Refill  . omeprazole (PRILOSEC) 40 MG capsule TAKE 1 CAPSULE BY MOUTH EVERY DAY 30 capsule 3   No current facility-administered medications for this visit.     Social Hx:  reports that she has been smoking cigarettes.  She started smoking about 14 years ago. She has never used smokeless tobacco.   Objective:   BP (!) 144/86   Pulse 90   Temp 98.3 F (36.8 C) (Oral)   Ht 5' 5.5" (1.664 m)   Wt 166 lb (75.3 kg)   LMP 10/26/2017 (Exact Date)   SpO2 99%   BMI 27.20 kg/m  Physical  Exam  Gen: NAD, alert, cooperative with exam, well-appearing Cardiac: Regular rate and rhythm, normal S1/S2, no murmur, no edema, capillary refill brisk  Respiratory: Clear to auscultation bilaterally, no wheezes, non-labored breathing Gastrointestinal: soft, non distended, hyperactive bowel sounds, tender to palpation in right lower quadrant and suprapubic region Skin: no rashes, normal turgor  Neurological: no gross deficits.  Psych: good insight, normal mood and affect GYN:  External genitalia within normal limits.  Vaginal mucosa pink, moist, normal rugae.  Nonfriable cervix without lesions, blood on speculum exam.  Bimanual exam revealed normal, nongravid uterus. No adnexal masses palpable bilaterally.    Results for orders placed or performed in visit on 11/01/17  POCT urinalysis dipstick  Result Value Ref Range   Color, UA yellow yellow   Clarity, UA clear clear   Glucose, UA negative negative mg/dL   Bilirubin, UA negative negative   Ketones, POC UA negative negative mg/dL   Spec Grav, UA 1.010 1.010 - 1.025   Blood, UA trace-lysed (A) negative   pH, UA 7.5 5.0 - 8.0   Protein Ur, POC negative negative mg/dL   Urobilinogen, UA 0.2 0.2 or 1.0 E.U./dL   Nitrite, UA Negative Negative   Leukocytes, UA Negative Negative  POCT urine pregnancy  Result Value Ref Range   Preg Test, Ur Negative Negative  POCT UA - Microscopic Only  Result Value Ref Range   WBC, Ur, HPF,  POC NONE    RBC, urine, microscopic RARE    Bacteria, U Microscopic TRACE    Epithelial cells, urine per micros 0-3   POCT Wet Prep ([place]et [place])  Result Value Ref Range   Source Wet Prep POC VAG    WBC, Wet Prep HPF POC 0-3    Bacteria Wet Prep HPF POC Few Few   Clue Cells Wet Prep HPF POC None None   Clue Cells Wet Prep Whiff POC Negative Whiff    Yeast Wet Prep HPF POC None    KOH Wet Prep POC None    Trichomonas Wet Prep HPF POC Absent Absent  POCT hemoglobin  Result Value Ref Range   Hemoglobin 10.3 (A)  12.2 - 16.2 g/dL    Assessment & Plan:   1. Dysuria: Resolved. Diagnosed with UTI on April 26, completed 7-day course of Keflex.  UA without any signs of UTI today - POCT urinalysis dipstick - POCT UA - Microscopic Only  2. Right lower quadrant abdominal pain: Pain for approximately 2 weeks, onset was around the time that she was diagnosed with UTI.  Exam showing tenderness in right lower quadrant and suprapubic area, otherwise patient does not appear to be in pain.  No signs of UTI with a normal UA today.  Negative pregnancy test, less likely ectopic pregnancy.  Less likely appendicitis given no anorexia/nausea/vomiting. Concern for possible GI etiology as patient notes that she was supposed to have a colonoscopy for constipation in the past but did not have this done, she is in her own words having intermittent "black tarry stools" and hematochezia when wiping.  Point-of-care hemoglobin today is 10.3, hemoglobin last year was normal at 14.6.   - Ambulatory referral to Gastroenterology - POCT urine pregnancy - POCT Wet Prep [facility]) - Cervicovaginal ancillary only - Tylenol as needed for pain - Continue PPI - If this is not GI in etiology and STD testing comes back negative, could consider further abdominal imaging and/or referral to gynecology -Strict ED precautions discussed -Follow-up in 1 month  3. Black tarry stools: Patient endorsed this in her own words - Ambulatory referral to Gastroenterology - POCT hemoglobin  4. Hematochezia: Has a history of hemorrhoids, did not examine rectum today - POCT hemoglobin   Smitty Cords, MD [place], PGY-3

## 2017-11-02 LAB — CERVICOVAGINAL ANCILLARY ONLY
CHLAMYDIA, DNA PROBE: NEGATIVE
NEISSERIA GONORRHEA: NEGATIVE

## 2017-11-04 ENCOUNTER — Encounter: Payer: Self-pay | Admitting: Family Medicine

## 2017-11-04 ENCOUNTER — Encounter: Payer: Self-pay | Admitting: Gastroenterology

## 2017-11-21 ENCOUNTER — Ambulatory Visit: Payer: BLUE CROSS/BLUE SHIELD | Admitting: Family Medicine

## 2017-11-21 DIAGNOSIS — D649 Anemia, unspecified: Secondary | ICD-10-CM

## 2017-11-21 NOTE — Progress Notes (Signed)
Patient was scheduled to have an appointment today. She was just supposed to come in for a lab visit for anemia. Anemia Panel placed.   Smitty Cords, MD [place], PGY-3

## 2017-11-22 LAB — ANEMIA PANEL
FERRITIN: 8 ng/mL — AB (ref 15–150)
FOLATE, HEMOLYSATE: 351.3 ng/mL
Folate, RBC: 1039 ng/mL (ref >498)
Hematocrit: 33.8 % — ABNORMAL LOW (ref 34.0–46.6)
Iron Saturation: 11 % — ABNORMAL LOW (ref 15–55)
Iron: 47 ug/dL (ref 27–159)
RETIC CT PCT: 0.6 % (ref 0.6–2.6)
Total Iron Binding Capacity: 433 ug/dL (ref 250–450)
UIBC: 386 ug/dL (ref 131–425)
VITAMIN B 12: 346 pg/mL (ref 232–1245)

## 2017-11-23 ENCOUNTER — Telehealth (HOSPITAL_COMMUNITY): Payer: Self-pay | Admitting: Family Medicine

## 2017-11-23 MED ORDER — FERROUS SULFATE 325 (65 FE) MG PO TABS: 325.0000 mg | ORAL_TABLET | Freq: Every day | ORAL | 0 refills | Status: DC

## 2017-11-23 NOTE — Telephone Encounter (Signed)
Pt lm on nurse line.  She is requesting that MD call her back as she has more questions. Elizabeth Hodge, Salome Spotted, CMA

## 2017-11-23 NOTE — Telephone Encounter (Signed)
Called patient back. No answer. Left voicemail.   Smitty Cords, MD [place], PGY-3

## 2017-11-23 NOTE — Telephone Encounter (Signed)
Called patient. No answer. Left voicemail. Her iron is low. Iron pills sent to her pharmacy.   Smitty Cords, MD [place], PGY-3

## 2017-11-24 ENCOUNTER — Ambulatory Visit: Payer: BLUE CROSS/BLUE SHIELD | Admitting: Gastroenterology

## 2017-11-24 ENCOUNTER — Encounter: Payer: Self-pay | Admitting: Gastroenterology

## 2017-11-24 VITALS — BP 116/68 | HR 91 | Ht 65.0 in | Wt 161.0 lb

## 2017-11-24 DIAGNOSIS — K625 Hemorrhage of anus and rectum: Secondary | ICD-10-CM

## 2017-11-24 DIAGNOSIS — D509 Iron deficiency anemia, unspecified: Secondary | ICD-10-CM | POA: Diagnosis not present

## 2017-11-24 DIAGNOSIS — K589 Irritable bowel syndrome without diarrhea: Secondary | ICD-10-CM

## 2017-11-24 MED ORDER — NA SULFATE-K SULFATE-MG SULF 17.5-3.13-1.6 GM/177ML PO SOLN: 1.0000 | ORAL | 0 refills | Status: DC

## 2017-11-24 NOTE — Progress Notes (Addendum)
11/24/2017 Elizabeth Hodge [id number] 04/19/1971   HISTORY OF PRESENT ILLNESS:  This is a 47 year old female who is new to our office.  She presents here today at the request of her PCP, Dr. Juanito Doom, for evaluation of several GI issues, but primarily anemia and dark stools.  Patient was previously seen by Dr. Collene Mares and given a diagnosis of IBS and GERD as well, but never had any procedures performed there.  Anyway, the patient tells me that she's had long-standing issues with alternating bowel habits between constipation and loose stools.  Says that she does have a BM every day but she does take stools softeners and oils to help her go.  Then 2 or 3 times per week she will have an episode of diarrhea.  She reports acid reflux but has been on prilosec daily for the past year or so and feels like it helps for the most part but does still get some breakthrough symptoms and occasional hoarseness.  She's also had ongoing issues with IDA for several years, it appears dating back to 2008.  Recently, however, she has been experiencing some rectal bleeding on and off that she thinks is probably due to hemorrhoids, does see it on the toilet paper and in the toilet at times as well.  Says that stools are often very dark in color as well.  She is on ferrous sulfate daily.  Recent Hgb was 10.3 grams, TIBC normal at 433, serum iron normal at 47, but saturation low at 11%, and ferritin low at 8.  Vitamin B12 and folate levels are normal.     Past Medical History:  Diagnosis Date  . Back pain    has bulging disc in lumbar area, follows with orthopedic   . GERD (gastroesophageal reflux disease)   . IBS (irritable bowel syndrome) 2009  . Tobacco abuse 2004   Past Surgical History:  Procedure Laterality Date  . TUBAL LIGATION  1998    reports that she has been smoking cigarettes.  She started smoking about 14 years ago. She has never used smokeless tobacco. She reports that she drinks alcohol. She reports  that she does not use drugs. family history includes Alcohol abuse in her brother; Asthma in her brother, father, and mother; Breast cancer in her sister; Colon cancer in her paternal grandmother; Colon polyps in her father; Diabetes in her maternal grandmother; Drug abuse in her brother; Heart failure in her brother, father, and mother; Hyperlipidemia in her brother, father, and mother; Hypertension in her brother, father, and mother; Prostate cancer in her father; Stroke in her brother. No Known Allergies    Outpatient Encounter Medications as of 11/24/2017  Medication Sig  . AMBULATORY NON FORMULARY MEDICATION Medication Name: CBD oil 250mg  and CBD cream 100mg  Daily  . AMBULATORY NON FORMULARY MEDICATION Medication Name: XCT oil for liver function, digestion, brain function  . Cranberry-Vitamin C-Probiotic (AZO CRANBERRY) 250-30 MG TABS Take by mouth daily as needed.  . docusate sodium (STOOL SOFTENER) 100 MG capsule Take 100 mg by mouth daily.  . ferrous sulfate 325 (65 FE) MG tablet Take 1 tablet (325 mg total) by mouth daily with breakfast.  . Magnesium 200 MG TABS Take by mouth.  Marland Kitchen omeprazole (PRILOSEC) 40 MG capsule TAKE 1 CAPSULE BY MOUTH EVERY DAY  . vitamin B-12 (CYANOCOBALAMIN) 100 MCG tablet Take 100 mcg by mouth daily.  . Vitamin D, Cholecalciferol, 400 units TABS Take by mouth.   No facility-administered encounter medications  on file as of 11/24/2017.      REVIEW OF SYSTEMS  : All other systems reviewed and negative except where noted in the History of Present Illness.   PHYSICAL EXAM: BP 116/68   Pulse 91   Ht 5\' 5"  (1.651 m)   Wt 161 lb (73 kg)   LMP 10/26/2017 (Exact Date)   BMI 26.79 kg/m  General: Well developed female in no acute distress Head: Normocephalic and atraumatic Eyes:  Sclerae anicteric, conjunctiva pink. Ears: Normal auditory acuity Lungs: Clear throughout to auscultation; no increased WOB. Heart: Regular rate and rhythm; no M/R/G. Abdomen: Soft,  non-distended.  BS present.  Non-tender. Rectal:  Will be done at the time of colonoscopy. Musculoskeletal: Symmetrical with no gross deformities  Skin: No lesions on visible extremities Extremities: No edema  Neurological: Alert oriented x 4, grossly non-focal Psychological:  Alert and cooperative. Normal mood and affect  ASSESSMENT AND PLAN: 47 year old female with multiple GI complaints.  Has long-standing alternating bowel habits and was given a diagnosis of IBS by Dr. Collene Mares in 2008.  Also has ongoing IDA for several years as well, but now in the setting of intermittent rectal bleeding and black stools.  She is on iron so I am curious if that is the cause of her dark stools.    -Will plan for both EGD and colonoscopy with Dr. Hilarie Fredrickson.   -Will start daily powder fiber supplement such as Benefiber or Citrucel to help regulate her bowel habits.  **The risks, benefits, and alternatives to EGD and colonscopy were discussed with the patient and she consents to proceed.   CC:  Carlyle Dolly, MD   Addendum: Reviewed and agree with initial management. Pyrtle, Lajuan Lines, MD

## 2017-11-24 NOTE — Patient Instructions (Signed)
Benefiber or Citrucel powder fiber daily.   You have been scheduled for an endoscopy and colonoscopy. Please follow the written instructions given to you at your visit today. Please pick up your prep supplies at the pharmacy within the next 1-3 days. If you use inhalers (even only as needed), please bring them with you on the day of your procedure. Your physician has requested that you go to www.startemmi.com and enter the access code given to you at your visit today. This web site gives a general overview about your procedure. However, you should still follow specific instructions given to you by our office regarding your preparation for the procedure.

## 2017-11-25 ENCOUNTER — Telehealth: Payer: Self-pay | Admitting: Gastroenterology

## 2017-11-25 NOTE — Telephone Encounter (Signed)
Patient notified that she will have sample for bowel prep to just call us one week before procedure to place it up front for her.

## 2017-11-25 NOTE — Telephone Encounter (Signed)
Patient states prep for colon on 7.31.19 with Dr.Pyrtle is requiring a prior auth. Pt wants to know how to get it cheaper. Pt had ov yesterday 6.6.19 with APP Jess.

## 2017-11-25 NOTE — Telephone Encounter (Signed)
Left message on patients voicemail to call office.   

## 2017-12-02 ENCOUNTER — Encounter: Payer: Self-pay | Admitting: Gastroenterology

## 2017-12-02 DIAGNOSIS — D509 Iron deficiency anemia, unspecified: Secondary | ICD-10-CM | POA: Insufficient documentation

## 2017-12-02 DIAGNOSIS — K625 Hemorrhage of anus and rectum: Secondary | ICD-10-CM | POA: Insufficient documentation

## 2017-12-02 DIAGNOSIS — K589 Irritable bowel syndrome without diarrhea: Secondary | ICD-10-CM | POA: Insufficient documentation

## 2017-12-30 ENCOUNTER — Other Ambulatory Visit: Payer: Self-pay | Admitting: Family Medicine

## 2018-01-03 ENCOUNTER — Encounter: Payer: Self-pay | Admitting: Family Medicine

## 2018-01-03 ENCOUNTER — Ambulatory Visit (INDEPENDENT_AMBULATORY_CARE_PROVIDER_SITE_OTHER): Payer: BLUE CROSS/BLUE SHIELD | Admitting: Family Medicine

## 2018-01-03 ENCOUNTER — Other Ambulatory Visit: Payer: Self-pay

## 2018-01-03 VITALS — BP 110/64 | HR 103 | Temp 97.8°F | Ht 65.0 in | Wt 160.0 lb

## 2018-01-03 DIAGNOSIS — R5383 Other fatigue: Secondary | ICD-10-CM | POA: Diagnosis not present

## 2018-01-03 DIAGNOSIS — Z114 Encounter for screening for human immunodeficiency virus [HIV]: Secondary | ICD-10-CM

## 2018-01-03 DIAGNOSIS — D649 Anemia, unspecified: Secondary | ICD-10-CM | POA: Diagnosis not present

## 2018-01-03 NOTE — Progress Notes (Signed)
    Subjective:  Elizabeth Hodge is a 47 y.o. female who presents to the [facility] today with a chief complaint of fatigue.   HPI:  Fatigue Has been feeling tired and slightly short of breath for the last couple of weeks. Noticed it especially [place] She also says that at her chiropractor appt that they have noticed her Bp was slightly low and HR was a little fast so recommended that she come in for appt. States feels similar to when she has been anemic in the past. No chest pain. No falls. No presyncope or syncope.   ROS: Per HPI  Objective:  Physical Exam: BP 110/64   Pulse (!) 103   Temp 97.8 F (36.6 C) (Oral)   Ht 5\' 5"  (1.651 m)   Wt 160 lb (72.6 kg)   LMP 12/24/2017 (Exact Date)   SpO2 99%   BMI 26.63 kg/m   Gen: NAD, resting comfortably CV: RRR with no murmurs appreciated Pulm: NWOB, CTAB with no crackles, wheezes, or rhonchi GI: Normal bowel sounds present. Soft, Nontender, Nondistended. MSK: no edema, cyanosis, or clubbing noted Skin: warm, dry. No pallor  Neuro: grossly normal, moves all extremities Psych: Normal affect and thought content   Assessment/Plan:  1. Fatigue, unspecified type Likely secondary to anemia. Per chart review, she has had dark stools in the past that now cannot be reliably detected since she is compliant on iron pills. She is scheduled to undergo colonoscopy and EGD on 12/21/17 per GI. Check labs as per orders.  - CBC - Basic metabolic panel - TSH  Bufford Lope, DO PGY-3, [facility] 01/03/2018 1:46 PM

## 2018-01-03 NOTE — Patient Instructions (Signed)
It was good to see you today!  For your anemia,  - Keep taking your iron pills - We are checking some labs today. If results require attention, either myself or my nurse will get in touch with you. If everything is normal, you will get a letter in the mail or a message in My Chart. Please give [place] a call if you do not hear from [place] after 2 weeks.   Please bring all of your medications with you to each visit.   Sign up for My Chart to have easy access to your labs results, and communication with your primary care physician.  Feel free to call with any questions or concerns at any time, at [phone].   Take care,  Dr. Bufford Lope, [place]

## 2018-01-04 ENCOUNTER — Encounter: Payer: Self-pay | Admitting: Family Medicine

## 2018-01-04 LAB — CBC
HEMATOCRIT: 40.7 % (ref 34.0–46.6)
Hemoglobin: 13.2 g/dL (ref 11.1–15.9)
MCH: 28.6 pg (ref 26.6–33.0)
MCHC: 32.4 g/dL (ref 31.5–35.7)
MCV: 88 fL (ref 79–97)
PLATELETS: 262 10*3/uL (ref 150–450)
RBC: 4.61 x10E6/uL (ref 3.77–5.28)
RDW: 21.8 % — ABNORMAL HIGH (ref 12.3–15.4)
WBC: 4.5 10*3/uL (ref 3.4–10.8)

## 2018-01-04 LAB — TSH: TSH: 1.82 u[IU]/mL (ref 0.450–4.500)

## 2018-01-04 LAB — BASIC METABOLIC PANEL
BUN / CREAT RATIO: 7 — AB (ref 9–23)
BUN: 7 mg/dL (ref 6–24)
CALCIUM: 9.7 mg/dL (ref 8.7–10.2)
CHLORIDE: 103 mmol/L (ref 96–106)
CO2: 22 mmol/L (ref 20–29)
Creatinine, Ser: 1 mg/dL (ref 0.57–1.00)
GFR calc non Af Amer: 69 mL/min/{1.73_m2} (ref >59)
GFR, EST AFRICAN AMERICAN: 79 mL/min/{1.73_m2} (ref >59)
Glucose: 76 mg/dL (ref 65–99)
Potassium: 4.3 mmol/L (ref 3.5–5.2)
Sodium: 140 mmol/L (ref 134–144)

## 2018-01-04 LAB — HIV ANTIBODY (ROUTINE TESTING W REFLEX): HIV Screen 4th Generation wRfx: NONREACTIVE

## 2018-01-11 ENCOUNTER — Encounter: Payer: Self-pay | Admitting: Internal Medicine

## 2018-01-18 ENCOUNTER — Encounter: Payer: Self-pay | Admitting: Internal Medicine

## 2018-01-18 ENCOUNTER — Ambulatory Visit (AMBULATORY_SURGERY_CENTER): Payer: BLUE CROSS/BLUE SHIELD | Admitting: Internal Medicine

## 2018-01-18 VITALS — BP 136/78 | HR 75 | Temp 98.6°F | Resp 18 | Ht 65.0 in | Wt 161.0 lb

## 2018-01-18 DIAGNOSIS — D129 Benign neoplasm of anus and anal canal: Secondary | ICD-10-CM

## 2018-01-18 DIAGNOSIS — K635 Polyp of colon: Secondary | ICD-10-CM

## 2018-01-18 DIAGNOSIS — K2990 Gastroduodenitis, unspecified, without bleeding: Secondary | ICD-10-CM

## 2018-01-18 DIAGNOSIS — K2970 Gastritis, unspecified, without bleeding: Secondary | ICD-10-CM

## 2018-01-18 DIAGNOSIS — D128 Benign neoplasm of rectum: Secondary | ICD-10-CM

## 2018-01-18 DIAGNOSIS — K621 Rectal polyp: Secondary | ICD-10-CM

## 2018-01-18 DIAGNOSIS — B9681 Helicobacter pylori [H. pylori] as the cause of diseases classified elsewhere: Secondary | ICD-10-CM | POA: Diagnosis not present

## 2018-01-18 DIAGNOSIS — D125 Benign neoplasm of sigmoid colon: Secondary | ICD-10-CM

## 2018-01-18 DIAGNOSIS — K2950 Unspecified chronic gastritis without bleeding: Secondary | ICD-10-CM | POA: Diagnosis not present

## 2018-01-18 DIAGNOSIS — K625 Hemorrhage of anus and rectum: Secondary | ICD-10-CM

## 2018-01-18 DIAGNOSIS — D509 Iron deficiency anemia, unspecified: Secondary | ICD-10-CM

## 2018-01-18 MED ORDER — SODIUM CHLORIDE 0.9 % IV SOLN: 500.0000 mL | Freq: Once | INTRAVENOUS | Status: DC

## 2018-01-18 NOTE — Progress Notes (Signed)
Pt's states no medical or surgical changes since previsit or office visit. 

## 2018-01-18 NOTE — Op Note (Signed)
[facility] Patient Name: Elizabeth Hodge Procedure Date: 01/18/2018 3:16 PM MRN: [id number] Endoscopist: Jerene Bears , MD Age: 47 Referring MD:  Date of Birth: 1971-05-31 Gender: Female Account #: [id number] Procedure:                Upper GI endoscopy Indications:              Iron deficiency anemia Medicines:                Monitored Anesthesia Care Procedure:                Pre-Anesthesia Assessment:                           - Prior to the procedure, a History and Physical                            was performed, and patient medications and                            allergies were reviewed. The patient's tolerance of                            previous anesthesia was also reviewed. The risks                            and benefits of the procedure and the sedation                            options and risks were discussed with the patient.                            All questions were answered, and informed consent                            was obtained. Prior Anticoagulants: The patient has                            taken no previous anticoagulant or antiplatelet                            agents. ASA Grade Assessment: II - A patient with                            mild systemic disease. After reviewing the risks                            and benefits, the patient was deemed in                            satisfactory condition to undergo the procedure.                           After obtaining informed consent, the endoscope was  passed under direct vision. Throughout the                            procedure, the patient's blood pressure, pulse, and                            oxygen saturations were monitored continuously. The                            Model GIF-[id number] scope was introduced                            through the mouth, and advanced to the second part                            of duodenum. The upper GI  endoscopy was                            accomplished without difficulty. The patient                            tolerated the procedure well. Scope In: Scope Out: Findings:                 The examined esophagus was normal.                           Diffuse mild inflammation characterized by erythema                            was found in the gastric fundus, in the gastric                            body and in the gastric antrum. Biopsies were taken                            with a cold forceps for histology and Helicobacter                            pylori testing.                           The examined duodenum was normal. Biopsies for                            histology were taken with a cold forceps for                            evaluation of celiac disease. Complications:            No immediate complications. Estimated Blood Loss:     Estimated blood loss was minimal. Impression:               - Normal esophagus.                           -  Gastritis. Biopsied.                           - Normal examined duodenum. Biopsied. Recommendation:           - Patient has a contact number available for                            emergencies. The signs and symptoms of potential                            delayed complications were discussed with the                            patient. Return to normal activities tomorrow.                            Written discharge instructions were provided to the                            patient.                           - Resume previous diet.                           - Continue present medications.                           - Await pathology results.                           - See the other procedure note for documentation of                            additional recommendations. Jerene Bears, MD 01/18/2018 3:55:34 PM This report has been signed electronically.

## 2018-01-18 NOTE — Patient Instructions (Addendum)
Thank you for allowing [place] to care for you today!  Await pathology results by mail, 2-3 weeks.  Handouts  given for polyps and hemorrhoids.   Follow up appt with Dr Hilarie Fredrickson to discuss biopsy results, hemorrhoid options, and Ferritin, IBC levels.    YOU HAD AN ENDOSCOPIC PROCEDURE TODAY AT [facility]:   Refer to the procedure report that was given to you for any specific questions about what was found during the examination.  If the procedure report does not answer your questions, please call your gastroenterologist to clarify.  If you requested that your care partner not be given the details of your procedure findings, then the procedure report has been included in a sealed envelope for you to review at your convenience later.  YOU SHOULD EXPECT: Some feelings of bloating in the abdomen. Passage of more gas than usual.  Walking can help get rid of the air that was put into your GI tract during the procedure and reduce the bloating. If you had a lower endoscopy (such as a colonoscopy or flexible sigmoidoscopy) you may notice spotting of blood in your stool or on the toilet paper. If you underwent a bowel prep for your procedure, you may not have a normal bowel movement for a few days.  Please Note:  You might notice some irritation and congestion in your nose or some drainage.  This is from the oxygen used during your procedure.  There is no need for concern and it should clear up in a day or so.  SYMPTOMS TO REPORT IMMEDIATELY:   Following lower endoscopy (colonoscopy or flexible sigmoidoscopy):  Excessive amounts of blood in the stool  Significant tenderness or worsening of abdominal pains  Swelling of the abdomen that is new, acute  Fever of 100F or higher   Following upper endoscopy (EGD)  Vomiting of blood or coffee ground material  New chest pain or pain under the shoulder blades  Painful or persistently difficult swallowing  New shortness of breath  Fever of  100F or higher  Black, tarry-looking stools  For urgent or emergent issues, a gastroenterologist can be reached at any hour by calling [phone].   DIET:  We do recommend a small meal at first, but then you may proceed to your regular diet.  Drink plenty of fluids but you should avoid alcoholic beverages for 24 hours.  ACTIVITY:  You should plan to take it easy for the rest of today and you should NOT DRIVE or use heavy machinery until tomorrow (because of the sedation medicines used during the test).    FOLLOW UP: Our staff will call the number listed on your records the next business day following your procedure to check on you and address any questions or concerns that you may have regarding the information given to you following your procedure. If we do not reach you, we will leave a message.  However, if you are feeling well and you are not experiencing any problems, there is no need to return our call.  We will assume that you have returned to your regular daily activities without incident.  If any biopsies were taken you will be contacted by phone or by letter within the next 1-3 weeks.  Please call us at [phone] if you have not heard about the biopsies in 3 weeks.    SIGNATURES/CONFIDENTIALITY: You and/or your care partner have signed paperwork which will be entered into your electronic medical record.  These signatures attest  to the fact that that the information above on your After Visit Summary has been reviewed and is understood.  Full responsibility of the confidentiality of this discharge information lies with you and/or your care-partner. 

## 2018-01-18 NOTE — Progress Notes (Signed)
Called to room to assist during endoscopic procedure.  Patient ID and intended procedure confirmed with present staff. Received instructions for my participation in the procedure from the performing physician.  

## 2018-01-18 NOTE — Op Note (Signed)
[facility] Patient Name: Elizabeth Hodge Procedure Date: 01/18/2018 3:15 PM MRN: [id number] Endoscopist: Jerene Bears , MD Age: 47 Referring MD:  Date of Birth: [birth year] Gender: Female Account #: [id number] Procedure:                Colonoscopy Indications:              Iron deficiency anemia Medicines:                Monitored Anesthesia Care Procedure:                Pre-Anesthesia Assessment:                           - Prior to the procedure, a History and Physical                            was performed, and patient medications and                            allergies were reviewed. The patient's tolerance of                            previous anesthesia was also reviewed. The risks                            and benefits of the procedure and the sedation                            options and risks were discussed with the patient.                            All questions were answered, and informed consent                            was obtained. Prior Anticoagulants: The patient has                            taken no previous anticoagulant or antiplatelet                            agents. ASA Grade Assessment: II - A patient with                            mild systemic disease. After reviewing the risks                            and benefits, the patient was deemed in                            satisfactory condition to undergo the procedure.                           After obtaining informed consent, the colonoscope  was passed under direct vision. Throughout the                            procedure, the patient's blood pressure, pulse, and                            oxygen saturations were monitored continuously. The                            Model PCF-H190DL [id number]) scope was introduced                            through the anus and advanced to the cecum,                            identified by appendiceal orifice and  ileocecal                            valve. The colonoscopy was performed without                            difficulty. The patient tolerated the procedure                            well. The quality of the bowel preparation was                            good. The ileocecal valve, appendiceal orifice, and                            rectum were photographed. Scope In: 3:33:38 PM Scope Out: 3:52:28 PM Scope Withdrawal Time: 0 hours 12 minutes 56 seconds  Total Procedure Duration: 0 hours 18 minutes 50 seconds  Findings:                 The digital rectal exam was normal.                           Two sessile polyps were found in the rectum and                            distal sigmoid colon. The polyps were 3 to 4 mm in                            size. These polyps were removed with a cold snare.                            Resection and retrieval were complete.                           Internal hemorrhoids were found during                            retroflexion. The hemorrhoids were medium-sized. Complications:  No immediate complications. Estimated Blood Loss:     Estimated blood loss was minimal. Impression:               - Two 3 to 4 mm polyps in the rectum and in the                            distal sigmoid colon, removed with a cold snare.                            Resected and retrieved.                           - Internal hemorrhoids. Recommendation:           - Patient has a contact number available for                            emergencies. The signs and symptoms of potential                            delayed complications were discussed with the                            patient. Return to normal activities tomorrow.                            Written discharge instructions were provided to the                            patient.                           - Resume previous diet.                           - Continue present medications.                            - Await pathology results.                           - Repeat colonoscopy is recommended. The                            colonoscopy date will be determined after pathology                            results from today's exam become available for                            review.                           - Continue oral iron. Repeat ferritin, IBC panel to                            ensure  improvement. Video capsule if not                            normalizing.                           - If hemorrhoidal bleeding, then banding would be                            an option for treatment. Jerene Bears, MD 01/18/2018 4:02:59 PM This report has been signed electronically.

## 2018-01-18 NOTE — Progress Notes (Signed)
Report to PACU, RN, vss, BBS= Clear.  

## 2018-01-19 ENCOUNTER — Telehealth: Payer: Self-pay | Admitting: *Deleted

## 2018-01-19 NOTE — Telephone Encounter (Signed)
Pt returned call and said she is doing good 

## 2018-01-19 NOTE — Telephone Encounter (Signed)
First follow up call attempt.  Voicemail with name identifier.  Message to call if questions or concerns.

## 2018-01-30 ENCOUNTER — Other Ambulatory Visit: Payer: Self-pay

## 2018-01-30 ENCOUNTER — Telehealth: Payer: Self-pay | Admitting: Internal Medicine

## 2018-01-30 DIAGNOSIS — A048 Other specified bacterial intestinal infections: Secondary | ICD-10-CM

## 2018-01-30 MED ORDER — OMEPRAZOLE 40 MG PO CPDR: 40.0000 mg | DELAYED_RELEASE_CAPSULE | Freq: Two times a day (BID) | ORAL | 0 refills | Status: DC

## 2018-01-30 MED ORDER — BIS SUBCIT-METRONID-TETRACYC 140-125-125 MG PO CAPS: 3.0000 | ORAL_CAPSULE | Freq: Three times a day (TID) | ORAL | 0 refills | Status: DC

## 2018-01-30 NOTE — Telephone Encounter (Signed)
See result note.  

## 2018-02-02 ENCOUNTER — Telehealth: Payer: Self-pay | Admitting: Family Medicine

## 2018-02-02 ENCOUNTER — Telehealth: Payer: Self-pay | Admitting: Internal Medicine

## 2018-02-02 NOTE — Telephone Encounter (Signed)
This is pyrtle

## 2018-02-02 NOTE — Telephone Encounter (Signed)
Pt called to inform that her insurance does not cover medication for H-Pylori, she wants to know if there is something else that she can get.

## 2018-02-02 NOTE — Telephone Encounter (Signed)
Dr Hilarie Fredrickson, would you like me to give her the doxycycline, metronidazole, pepto, omeprazole seperated course option?

## 2018-02-02 NOTE — Telephone Encounter (Signed)
Yes, okay to separate into the individual components of Pylera

## 2018-02-02 NOTE — Telephone Encounter (Signed)
Copied from [place] [id number]. Topic: General - Other >> Feb 02, 2018 12:28 PM Keene Breath wrote: Reason for CRM: Levada Dy with [employer] called regarding patient's prescription for bismuth-metronidazole-tetracycline [facility]) [phone] MG capsule.  Levada Dy stated that the insurance does not cover this medication.  Patient will need to have a non-formulary form filled out and faxed over to insurance company.  The fax # is [phone], Ref# [id number].  The form can be found on the [employer].  Please call patient if you have any questions.  CB# [phone].

## 2018-02-03 MED ORDER — CLARITHROMYCIN 500 MG PO TABS: 500.0000 mg | ORAL_TABLET | Freq: Two times a day (BID) | ORAL | 0 refills | Status: DC

## 2018-02-03 MED ORDER — AMOXICILLIN 500 MG PO TABS: 1000.0000 mg | ORAL_TABLET | Freq: Two times a day (BID) | ORAL | 0 refills | Status: DC

## 2018-02-03 NOTE — Telephone Encounter (Signed)
See additional 02/02/18 telephone note.

## 2018-02-03 NOTE — Telephone Encounter (Signed)
Change to: Amoxicillin 1000 mg BID and clarithromycin 500 mg BID x 10 days BID PPI with the regimen Cancel pylera and its components

## 2018-02-03 NOTE — Telephone Encounter (Signed)
Dr Hilarie Fredrickson, patient states that she took Doxycyline in not long ago for an infection in her leg and had vomiting every time she took the medication. She states she took the medication with food each time but this did not seem to help. Any suggestions for another medication substitute? I will add Doxycycline to her list of intolerances.

## 2018-02-03 NOTE — Telephone Encounter (Signed)
Patient advised that we will send amoxicillin, clarithromycin and omeprazole x 10 days for her to take in place of pylera. She verbalizes understanding.

## 2018-02-21 ENCOUNTER — Other Ambulatory Visit: Payer: Self-pay

## 2018-02-21 ENCOUNTER — Telehealth: Payer: Self-pay

## 2018-02-21 MED ORDER — FERROUS SULFATE 325 (65 FE) MG PO TABS: 325.0000 mg | ORAL_TABLET | Freq: Every day | ORAL | 1 refills | Status: AC

## 2018-02-21 NOTE — Telephone Encounter (Signed)
Pt states she has been having heavy bleeding since 02/04/18. Bleeding was heavy and passed large clots. Bleeding has tapered off now but also complains of intermittent lightheadedness. Has appt scheduled with her OB but not until late October. Would like to be seen here and have Hgb checked due to history of anemia. Scheduled for 02/28/18 at 11:10 with Dr. Owens Shark. Advised patient if heavy bleeding continues or she passes large clots again to contact the Triage nurse at her OBGYN, they may want to bring her in. Wallace Cullens, RN

## 2018-02-21 NOTE — Telephone Encounter (Signed)
Pt left message on nurse line stating she would like a call back from a nurse regarding a "menstral issue". Attempted to return call, no answer- left VM to call back Wallace Cullens, RN

## 2018-02-28 ENCOUNTER — Ambulatory Visit (INDEPENDENT_AMBULATORY_CARE_PROVIDER_SITE_OTHER): Payer: BLUE CROSS/BLUE SHIELD | Admitting: Psychology

## 2018-02-28 ENCOUNTER — Encounter: Payer: Self-pay | Admitting: Family Medicine

## 2018-02-28 ENCOUNTER — Other Ambulatory Visit: Payer: Self-pay

## 2018-02-28 ENCOUNTER — Ambulatory Visit: Payer: BLUE CROSS/BLUE SHIELD | Admitting: Family Medicine

## 2018-02-28 VITALS — BP 109/82 | HR 101 | Temp 97.6°F | Wt 159.0 lb

## 2018-02-28 DIAGNOSIS — F4321 Adjustment disorder with depressed mood: Secondary | ICD-10-CM

## 2018-02-28 DIAGNOSIS — Z23 Encounter for immunization: Secondary | ICD-10-CM

## 2018-02-28 DIAGNOSIS — F4320 Adjustment disorder, unspecified: Secondary | ICD-10-CM

## 2018-02-28 DIAGNOSIS — D509 Iron deficiency anemia, unspecified: Secondary | ICD-10-CM | POA: Diagnosis not present

## 2018-02-28 LAB — POCT HEMOGLOBIN: HEMOGLOBIN: 13.5 g/dL (ref 12.2–16.2)

## 2018-02-28 NOTE — Assessment & Plan Note (Addendum)
[place] Consultation with a focus on connecting patient to resources.    Patient is neatly groomed and appropriately dressed.  She maintains good eye contact and is cooperative and attentive.  Speech is normal in tone, rate and rhythm.  Mood is sad with a consistent affect.  Thought process is logical and goal directed.  No evidence of suicidal or homicidal ideation.  Does not appear to be responding to any internal stimuli.  Able to maintain train of thought and concentrate on the questions.  Judgment and insight are average.  Patient's main focus was on the loss of her friend (that was like a mother) in February.  She said several positive things about her loss including feeling some peace recently when she was going through some of her friend's things. Has not gotten grief counseling and would likely benefit.  Information provided for [facility] counseling services.  She said she would call today.   The consumption of alcohol remains concerning.  I will plan to call the patient in a week and check in on how she is doing.  May recommend her coming in for an [place] appointment to make sure substance use and / or mood issue isn't complicating the picture.  Has a reported family history of bipolar disorder (mother and sister).

## 2018-02-28 NOTE — Patient Instructions (Addendum)
It was wonderful to see you today.  Thank you for choosing [facility].   Please call [phone] with any questions about today's appointment.  Please be sure to schedule follow up at the front  desk before you leave today.   Dorena Dorfman, MD  Family Medicine    

## 2018-02-28 NOTE — Progress Notes (Signed)
Dr. Owens Shark requested a [place].   Presenting Issue:  Grief over a loss of a very close friend in February. 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 Long distance relationship.  Report of symptoms:  Tearful and sad.  Some panic like symptoms.    Duration of CURRENT symptoms:  Since February.  Age of onset of first mood disturbance:  Did not assess.  Impact on function:  Sleep problems. Seems to be able to care for her family member well.  Psychiatric History:  Did not assess.  Patient was most focused on grief.  Current and history of substance use:  Drinking wine daily and more than what is considered a safe amount.  Did not assess beyond this.    Warmhandoff:    Warm Hand Off Completed.

## 2018-02-28 NOTE — Progress Notes (Signed)
Subjective:  Elizabeth Hodge is a 47 y.o. female who presents to the [facility] today with a chief complaint of abnormal uterine bleeding and change in mood.   HPI:  Bleeding  Elizabeth Hodge reports that the abnormal uterine bleeding started August 17 and lasted until September 1st. She reports having to use both pads and tampons. She was passing golf ball sized clots. Patient reports her normal menstrual cycle lasts 7 days, is heavy with clots. The duration is what concerned her. She defers Pap today because she has an appointment with her Ob/Gyn in October.  Denies  chest pain with exertion, dyspnea with exertion.  She does endorse palpitations at times when she is anxious as described below.  Mood  Upon further evaluation the patient becomes tearful out of concern that she is sick. She reports her best friend passed away in 16-Sep-2022 and since then she has been concerned about her health. 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 She is present for this evaluation. She has a house in [place] where her boyfriend lives but is rarely able to visit due to her obligations to her brother. Patient reports difficulty falling asleep, episodes of heart palpitations, SOB, "feeling really hot" when helping sorting through her friends possessions. She reports increased alcohol intake saying she drinks 2 glasses of wine a night but they are bigger glasses than "regular" wine glasses.  Patient denies any personal psychiatric history. Acknowledges sister with bipolar/depression/complicated psychiatric history and mother with the same issues. Denies SI/HI. Reports her brother has firearms in the house but they are locked in a gun safe which she does not know how to access.   Objective:  Physical Exam: BP 109/82   Pulse (!) 101   Temp 97.6 F (36.4 C) (Oral)   Wt 159 lb (72.1 kg)   LMP 02/04/2018   SpO2 95%   BMI 26.46 kg/m   Gen: tearful.   Joined by her mother-in-law.  . CV: Warm and well-perfused. Pulm: Been comfortably on room air Skin: warm, dry Neuro: grossly normal, moves all extremities  Results for orders placed or performed in visit on 02/28/18 (from the past 72 hour(s))  Hemoglobin     Status: None   Collection Time: 02/28/18 11:16 AM  Result Value Ref Range   Hemoglobin 13.5 12.2 - 16.2 g/dL     Assessment/Plan:  Diagnoses and all orders for this visit:  Iron deficiency anemia, unspecified iron deficiency anemia type  -     Hemoglobin hemoglobin stable today.  She will follow-up with her gynecologist regarding abnormal uterine  bleeding.  Need for immunization against influenza -     Flu Vaccine QUAD 36+ mos IM  Grief reaction, acute, need further evaluation. Suspect patient may have underlying component of mood disorder.  No indication of alcohol abuse disorder at this time.  No thoughts of harming herself or others.  Suspect this is largely due to losing her friend without adequate time to grieve.  Discussed caregiver support consulted Dr. Gwenlyn Saran.  Resources provided.  We will follow-up in 3-4 weeks.  Given family history will screen for bipolar disorder.  John Gifford Shave, [facility]  02/28/2018 3:24 PM   Patient seen along with MS4 student Gifford Shave. I personally evaluated this patient along with the student, and verified all aspects of the history, physical exam, and medical decision making as documented by the student. I  agree with the student's documentation and have made all necessary edits.  Dorris Singh, MD  Family Medicine Teaching Service

## 2018-03-07 ENCOUNTER — Telehealth: Payer: Self-pay | Admitting: Psychology

## 2018-03-07 NOTE — Telephone Encounter (Signed)
Called patient to follow up on recommendation for grief counseling.  She called and was scheduled for first available at [facility] for individual counseling:  03/30/18.  Has follow-up with Dr. Owens Shark on the 11th.  Patient states [place] involvement last visit was a "wake up call" for her to be more aware of her needs.  Starting an exercise program with a friend.  Cutting back on drinking.  Getting out more.  Will work to prioritize her health and well being more.  Encouraged her to reach out to address alcohol use if she finds it difficult to manage.

## 2018-03-24 ENCOUNTER — Ambulatory Visit: Payer: BLUE CROSS/BLUE SHIELD | Admitting: Family Medicine

## 2018-03-31 ENCOUNTER — Encounter: Payer: Self-pay | Admitting: Family Medicine

## 2018-03-31 ENCOUNTER — Ambulatory Visit: Payer: BLUE CROSS/BLUE SHIELD | Admitting: Family Medicine

## 2018-03-31 VITALS — BP 112/78 | HR 94 | Temp 97.8°F | Ht 65.0 in | Wt 162.8 lb

## 2018-03-31 DIAGNOSIS — Z23 Encounter for immunization: Secondary | ICD-10-CM

## 2018-03-31 DIAGNOSIS — F4321 Adjustment disorder with depressed mood: Secondary | ICD-10-CM | POA: Diagnosis not present

## 2018-03-31 NOTE — Patient Instructions (Signed)
It was wonderful to see you today.  Thank you for choosing [facility].   Please call [phone] with any questions about today's appointment.  Please be sure to schedule follow up at the front  desk before you leave today.   Shalva Rozycki, MD  Family Medicine    

## 2018-03-31 NOTE — Progress Notes (Signed)
  Patient Name: Elizabeth Hodge Date of Birth: 1971-03-30 Date of Visit: 03/31/18 PCP: Marjie Skiff, MD  Chief Complaint: follow up, discuss quitting alcohol and smoking   Subjective: Elizabeth Hodge is a pleasant 47 y.o. year old woman with a history of tobacco use disorder, H.pylori infect, chronic iron deficiency anemia, and abnormal uterine bleeding (follows with Gynecology) presenting today for follow up from grief reaction. Elizabeth Hodge recently lost her very dear friend Elizabeth Hodge. She is recovering from this. She saw a grief counselor yesterday. She found this very, very helpful. She reports her mood is better. She is coloring and journaling.   Elizabeth Hodge is precontemplative in regards to smoking cessation. She is no longer using alcohol to help her sleep.   [name] completed H. Pylori therpay. She has follow up with GI for this.   Elizabeth Hodge sees her Gynecologist later this month for Pap and Mammogram.   ROS:  ROS Denies low mood, poor sleep, SI/HI.   I have reviewed the patient's medical, surgical, family, and social history as appropriate.   Vitals:   03/31/18 1141  BP: 112/78  Pulse: 94  Temp: 97.8 F (36.6 C)  SpO2: 99%   Filed Weights   03/31/18 1141  Weight: 162 lb 12.8 oz (73.8 kg)   Cardiac: Regular rate and rhythm. Normal S1/S2. No murmurs, rubs, or gallops appreciated. Lungs: Clear bilaterally to ascultation.  Extremities: Warm, well perfused without edema.  Skin: Warm, dry Psych: Pleasant and appropriate    Kyran was seen today for follow-up.  Diagnoses and all orders for this visit:  Need for Tdap vaccination -     Cancel: Tdap vaccine greater than or equal to 7yo IM not given due to supply   Need for pneumococcal vaccination -     Pneumococcal polysaccharide vaccine 23-valent greater than or equal to 2yo subcutaneous/IM  Grief reaction, improved. Recommended continuing therapy. Will continue to monitor. No indication for medication at this  time.  Smoking cessation, discussed long term health risks. Discussed quit strategies.   H. Pylori will follow up with GI for this.  Dorris Singh, MD  Family Medicine Teaching Service

## 2018-04-12 ENCOUNTER — Other Ambulatory Visit (INDEPENDENT_AMBULATORY_CARE_PROVIDER_SITE_OTHER): Payer: BLUE CROSS/BLUE SHIELD

## 2018-04-12 DIAGNOSIS — A048 Other specified bacterial intestinal infections: Secondary | ICD-10-CM

## 2018-04-12 LAB — CBC WITH DIFFERENTIAL/PLATELET
BASOS PCT: 1.4 % (ref 0.0–3.0)
Basophils Absolute: 0.1 10*3/uL (ref 0.0–0.1)
EOS PCT: 0.7 % (ref 0.0–5.0)
Eosinophils Absolute: 0 10*3/uL (ref 0.0–0.7)
HCT: 41.2 % (ref 36.0–46.0)
Hemoglobin: 13.9 g/dL (ref 12.0–15.0)
LYMPHS ABS: 1.6 10*3/uL (ref 0.7–4.0)
Lymphocytes Relative: 31.5 % (ref 12.0–46.0)
MCHC: 33.7 g/dL (ref 30.0–36.0)
MCV: 97.9 fl (ref 78.0–100.0)
Monocytes Absolute: 0.4 10*3/uL (ref 0.1–1.0)
Monocytes Relative: 6.9 % (ref 3.0–12.0)
NEUTROS ABS: 3 10*3/uL (ref 1.4–7.7)
NEUTROS PCT: 59.5 % (ref 43.0–77.0)
Platelets: 227 10*3/uL (ref 150.0–400.0)
RBC: 4.21 Mil/uL (ref 3.87–5.11)
RDW: 13.8 % (ref 11.5–15.5)
WBC: 5.1 10*3/uL (ref 4.0–10.5)

## 2018-04-12 LAB — IBC PANEL
Iron: 112 ug/dL (ref 42–145)
Saturation Ratios: 23.5 % (ref 20.0–50.0)
Transferrin: 340 mg/dL (ref 212.0–360.0)

## 2018-04-13 ENCOUNTER — Other Ambulatory Visit: Payer: Self-pay

## 2018-04-13 ENCOUNTER — Other Ambulatory Visit: Payer: BLUE CROSS/BLUE SHIELD

## 2018-04-13 DIAGNOSIS — A048 Other specified bacterial intestinal infections: Secondary | ICD-10-CM

## 2018-04-13 DIAGNOSIS — D509 Iron deficiency anemia, unspecified: Secondary | ICD-10-CM

## 2018-04-14 LAB — HELICOBACTER PYLORI  SPECIAL ANTIGEN
MICRO NUMBER:: 91280922
SPECIMEN QUALITY: ADEQUATE

## 2018-05-08 IMAGING — DX DG ANKLE COMPLETE 3+V*R*
3 series · 3 of 3 positions shown · non-contrast
Comparison: None.

CLINICAL DATA: Soft tissue injury anterior to the medial malleolus
with a pressure washer 2 days ago.

EXAM:
RIGHT ANKLE - COMPLETE 3+ VIEW

[ankle ap]
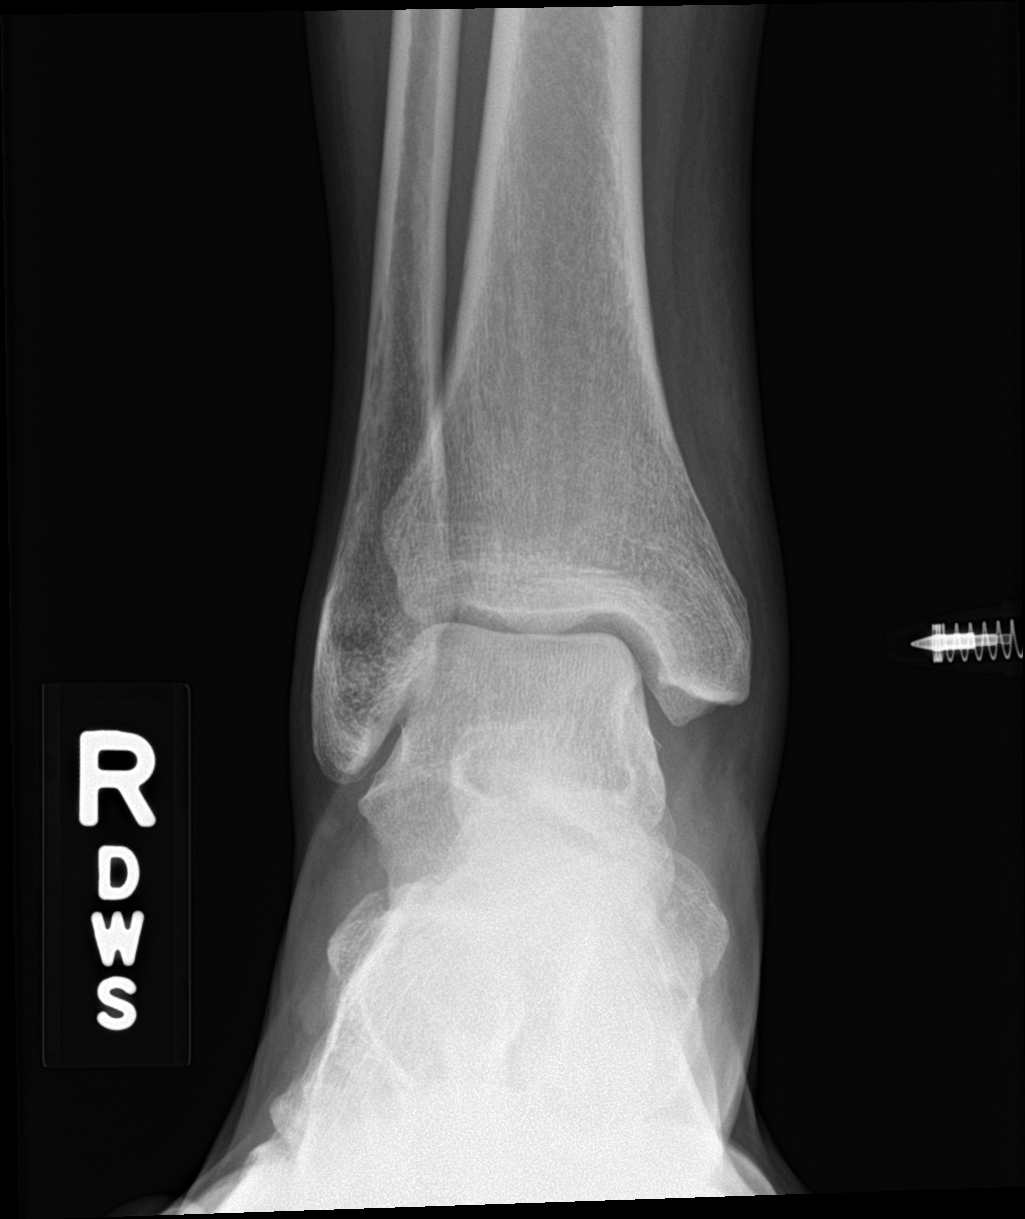

[ankle obl]
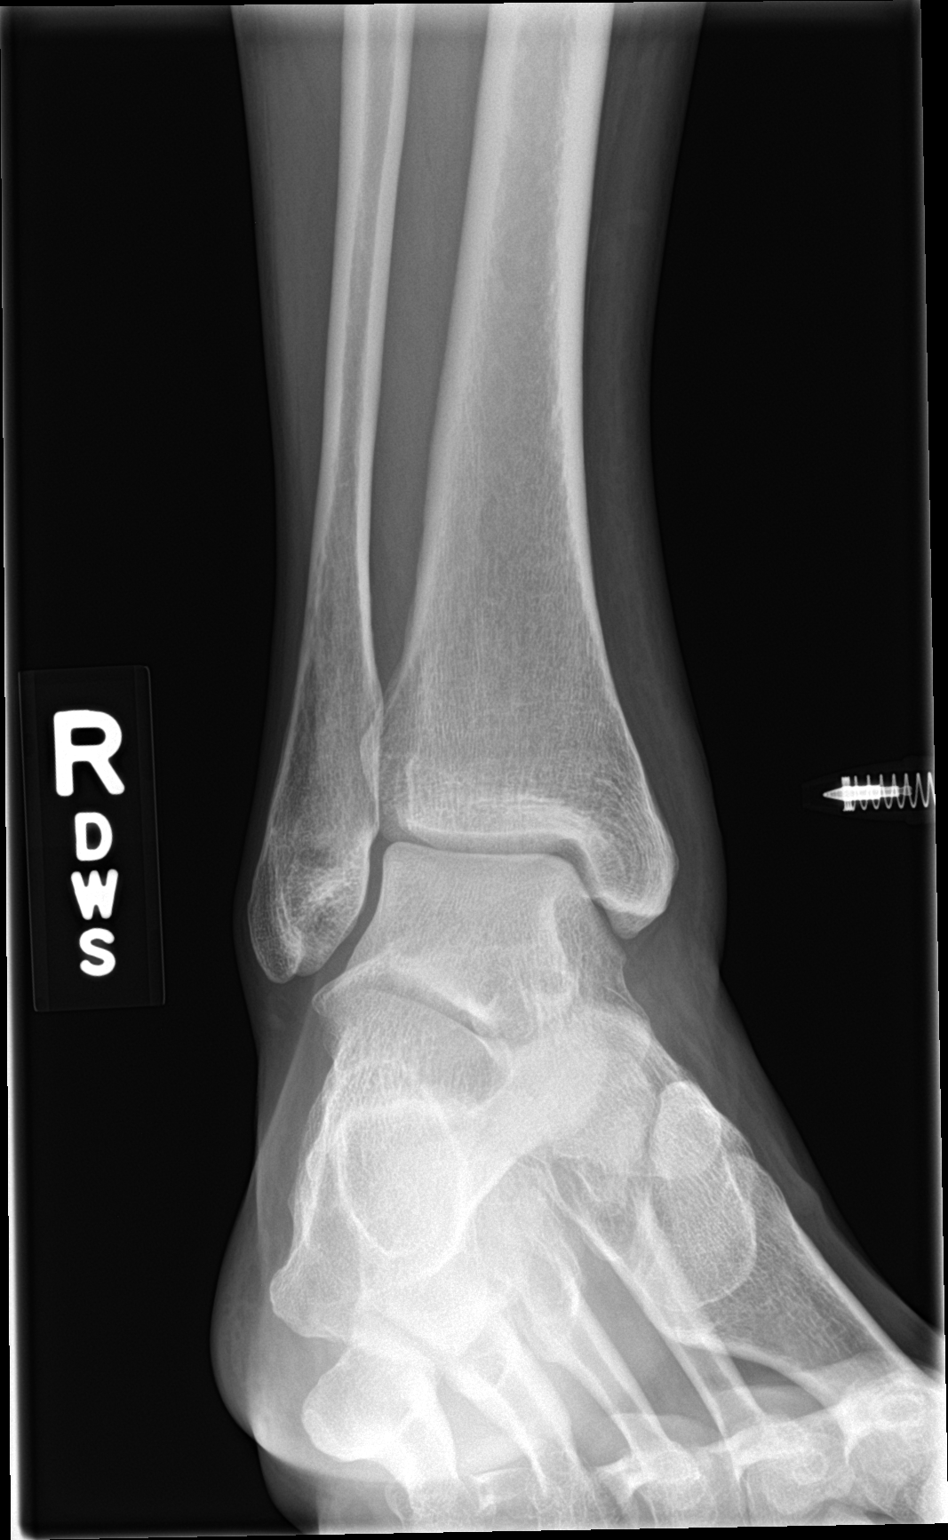

[ankle lat]
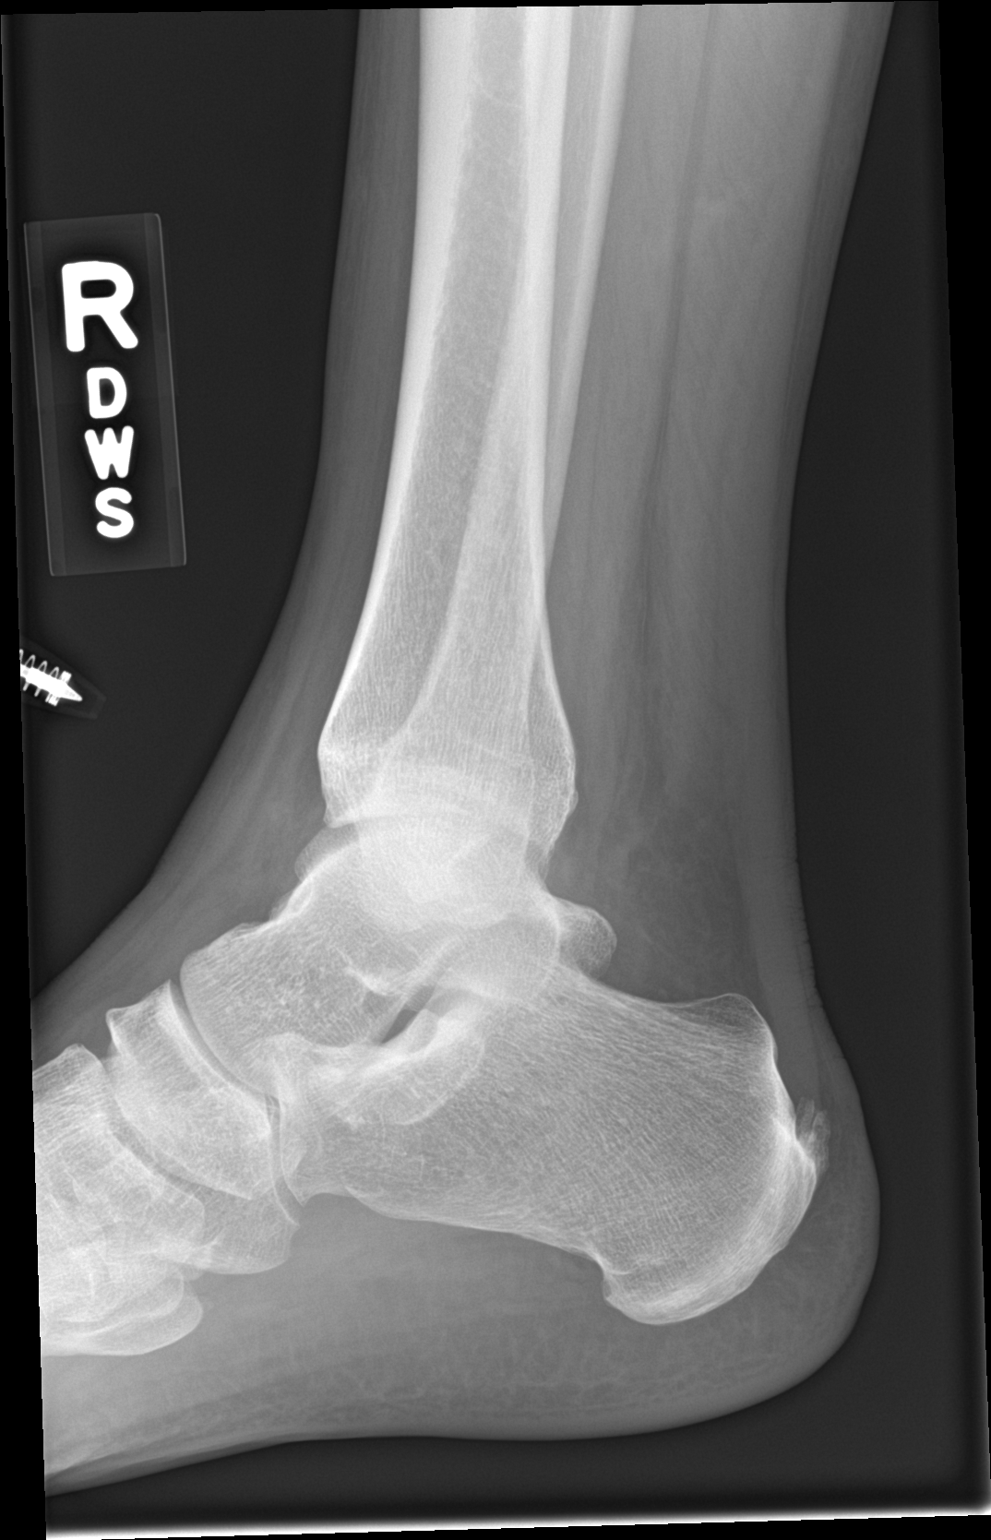

[3 of 3 positions shown; findings below may reference images not displayed]

FINDINGS: There is soft tissue swelling over the lateral malleolus but there
is no radiodense foreign body in the soft tissues. The bones appear
normal.
IMPRESSION: Nonspecific soft tissue swelling.  Otherwise, normal exam.

## 2018-05-25 ENCOUNTER — Other Ambulatory Visit: Payer: Self-pay | Admitting: Obstetrics and Gynecology

## 2018-05-25 DIAGNOSIS — Z803 Family history of malignant neoplasm of breast: Secondary | ICD-10-CM

## 2018-06-20 ENCOUNTER — Ambulatory Visit
Admission: RE | Admit: 2018-06-20 | Discharge: 2018-06-20 | Disposition: A | Discharge status: Home / Self Care | Payer: BLUE CROSS/BLUE SHIELD | Source: Ambulatory Visit | Attending: Obstetrics and Gynecology | Admitting: Obstetrics and Gynecology

## 2018-06-20 DIAGNOSIS — Z803 Family history of malignant neoplasm of breast: Secondary | ICD-10-CM

## 2018-06-20 MED ORDER — GADOBUTROL 1 MMOL/ML IV SOLN
6.0000 mL | Freq: Once | INTRAVENOUS | Status: AC | PRN
  Administered 2018-06-20: 6 mL via INTRAVENOUS

## 2018-06-22 ENCOUNTER — Other Ambulatory Visit: Payer: Self-pay | Admitting: Obstetrics and Gynecology

## 2018-06-22 DIAGNOSIS — R9389 Abnormal findings on diagnostic imaging of other specified body structures: Secondary | ICD-10-CM

## 2018-06-26 ENCOUNTER — Inpatient Hospital Stay: Admission: RE | Admit: 2018-06-26 | Payer: BLUE CROSS/BLUE SHIELD | Source: Ambulatory Visit

## 2018-07-07 ENCOUNTER — Ambulatory Visit
Admission: RE | Admit: 2018-07-07 | Discharge: 2018-07-07 | Disposition: A | Discharge status: Home / Self Care | Payer: BLUE CROSS/BLUE SHIELD | Source: Ambulatory Visit | Attending: Obstetrics and Gynecology | Admitting: Obstetrics and Gynecology

## 2018-07-07 DIAGNOSIS — R9389 Abnormal findings on diagnostic imaging of other specified body structures: Secondary | ICD-10-CM

## 2018-07-07 MED ORDER — GADOBUTROL 1 MMOL/ML IV SOLN
6.0000 mL | Freq: Once | INTRAVENOUS | Status: AC | PRN
  Administered 2018-07-07: 6 mL via INTRAVENOUS

## 2018-07-24 ENCOUNTER — Telehealth: Payer: Self-pay | Admitting: Internal Medicine

## 2018-07-24 NOTE — Telephone Encounter (Signed)
Pt called to inform that her [facility] is out of network with her [place] ins, so future scheduling will need to be at [facility].  Pt stated that she had defecated a worm.  Please advise.

## 2018-07-24 NOTE — Telephone Encounter (Signed)
I left a message that the patient should ask her primary care to see her for this concern or ask if they can refer her to a GI in her network.

## 2019-11-20 IMAGING — MR MR BILATERAL BREAST WITHOUT AND WITH CONTRAST
8 of 14 series · 32 of 48 positions shown · IV contrast (gadavist)
Comparison: Previous exams.  No prior breast MRIs.

CLINICAL DATA: High risk screening.

Family hx of breast ca with sister at 38y/o, cousin at 38y/o.
Lifetime risk of 22.2%. Pt is asymptomatic today. Pt is not taking
estrogen. No prior sx or bx on either breast. No prior breast MR.
LABS:  No labs drawn at time of imaging.
EXAM:
BILATERAL BREAST MRI WITH AND WITHOUT CONTRAST
TECHNIQUE: Multiplanar, multisequence MR images of both breasts were obtained
prior to and following the intravenous administration of 6 ml of
Gadavist

[Series 2: t2_tirm_tra ipat (a-p) · axial · 3.0mm · 0.70mm/px · 1 of 55 slices shown]
[im 1/55]
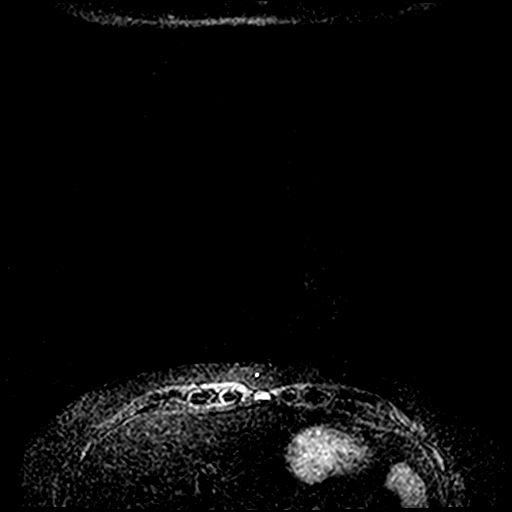

[Series 3: fl3d pre-cm no · axial · non-contrast · 1.2mm · 0.94mm/px · z∈[-60,+111]mm · 5 of 144 slices shown]
[im 1/144]
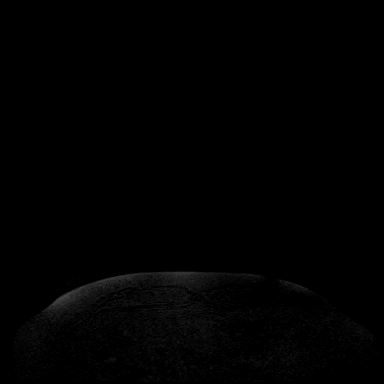
[im 36/144]
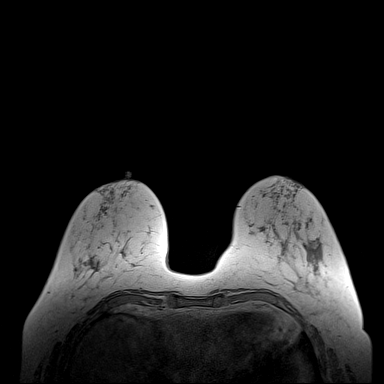
[im 72/144]
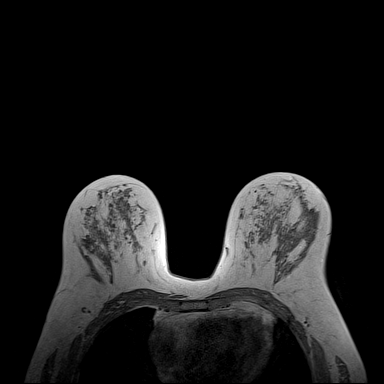
[im 108/144]
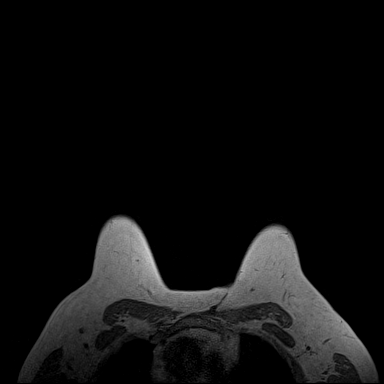
[im 144/144]
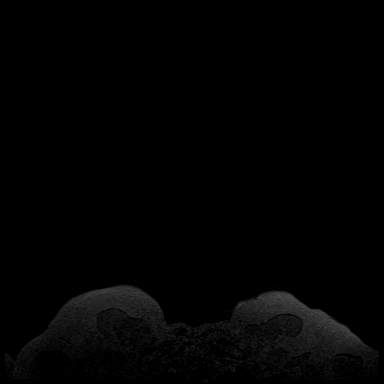

[Series 5: fl3d pre-cm · axial · non-contrast · 1.2mm · 0.94mm/px · z∈[-60,+111]mm · 5 of 144 slices shown]
[im 1/144]
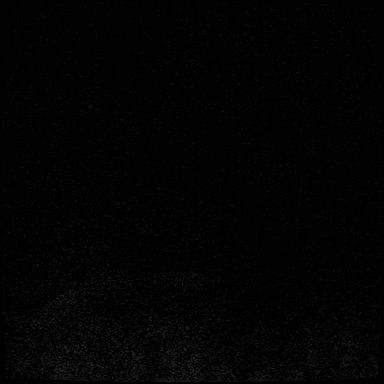
[im 36/144]
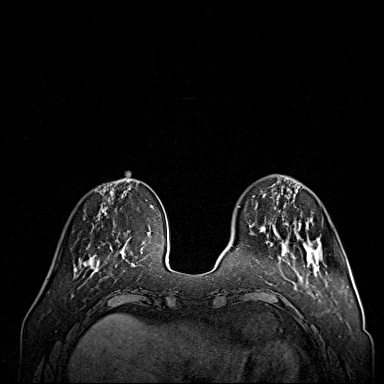
[im 72/144]
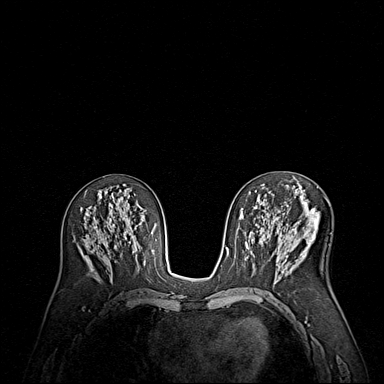
[im 108/144]
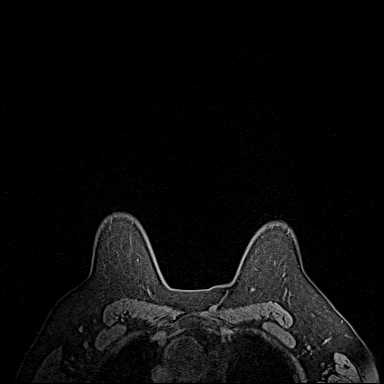
[im 144/144]
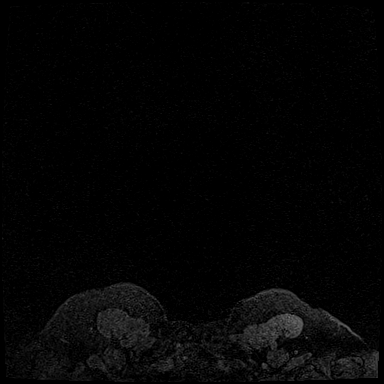

[Series 6: fl3d post immediate · axial · 1.2mm · 0.94mm/px · z∈[-60,+111]mm · 5 of 144 slices shown (1 of 3)]
[im 1/144]
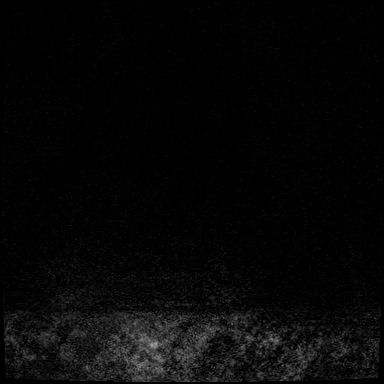
[im 36/144]
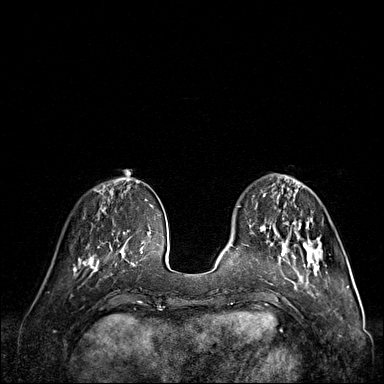
[im 72/144]
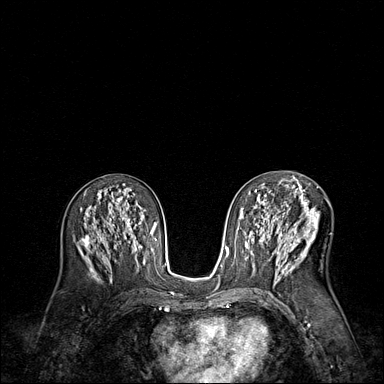
[im 108/144]
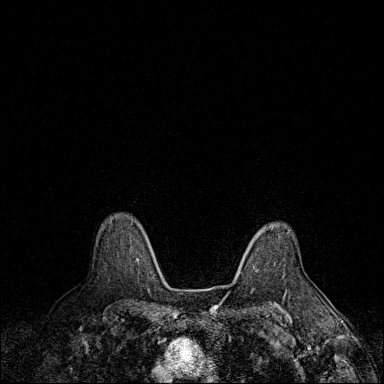
[im 144/144]
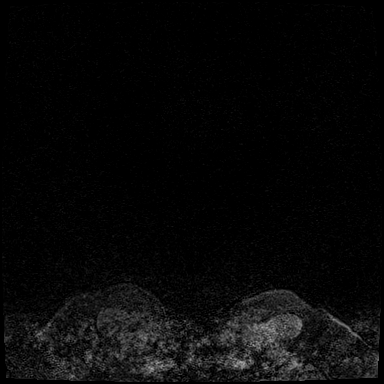

[Series 7: fl3d post immediate · axial · 1.2mm · 0.94mm/px · z∈[-60,+111]mm · 5 of 144 slices shown (2 of 3)]
[im 1/144]
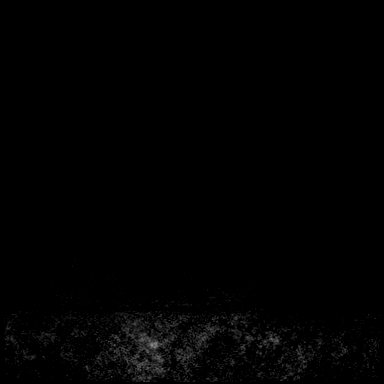
[im 36/144]
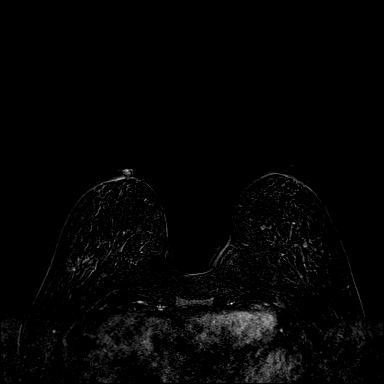
[im 72/144]
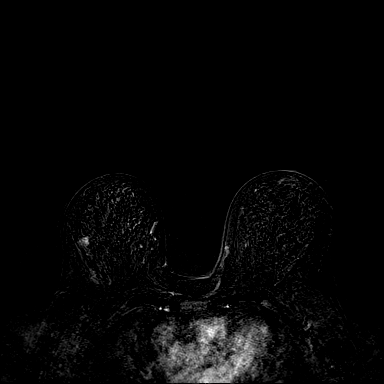
[im 108/144]
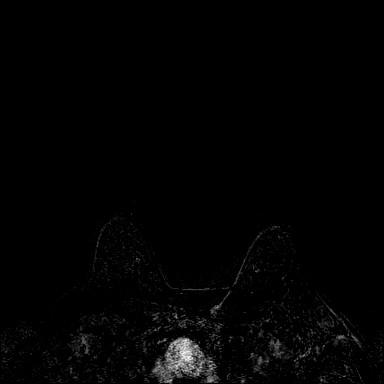
[im 144/144]
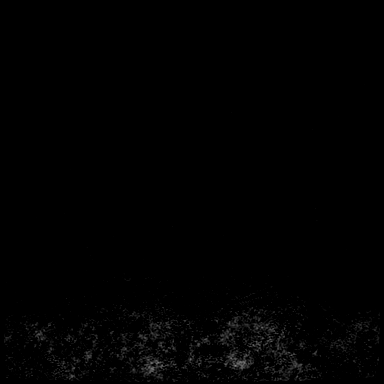

[Series 8: fl3d post immediate · axial · 172.8mm · 0.94mm/px · 1 of 1 slices shown (3 of 3)]
[im 1/1]
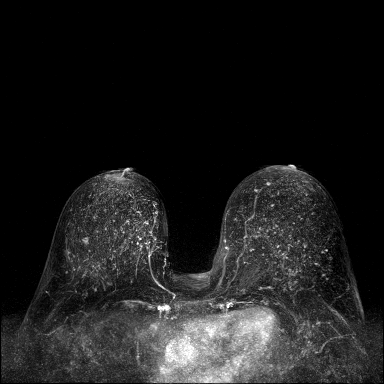

[Series 9: fl3d post 3min · axial · 1.2mm · 0.94mm/px · z∈[-60,+111]mm · 5 of 144 slices shown]
[im 1/144]
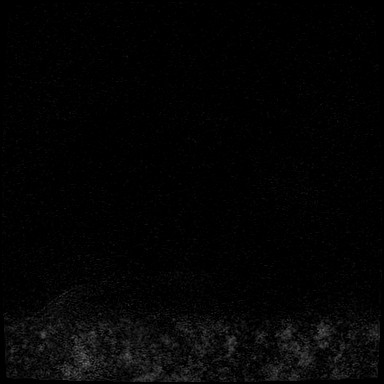
[im 36/144]
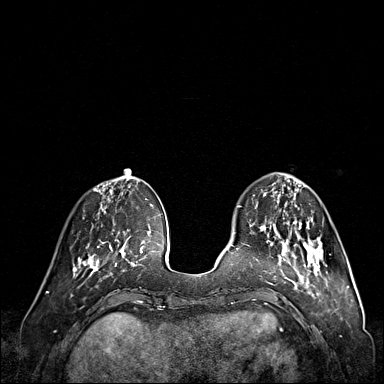
[im 72/144]
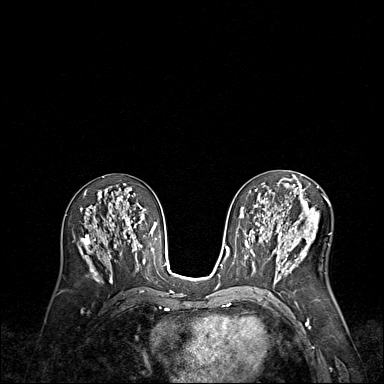
[im 108/144]
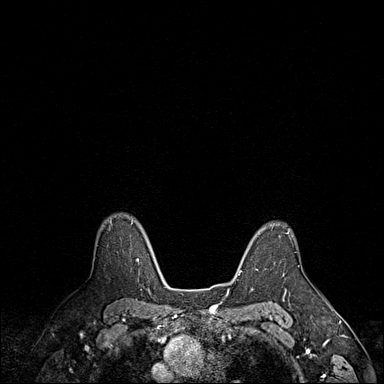
[im 144/144]
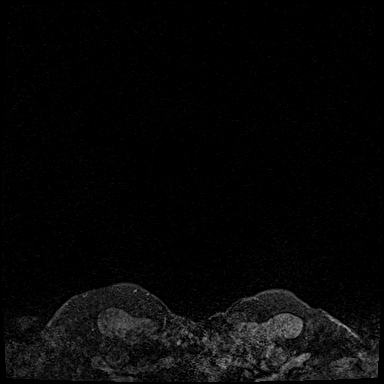

[Series 10: fl3d post 3min_sub · axial · 1.2mm · 0.94mm/px · z∈[-60,+111]mm · 5 of 144 slices shown]
[im 1/144]
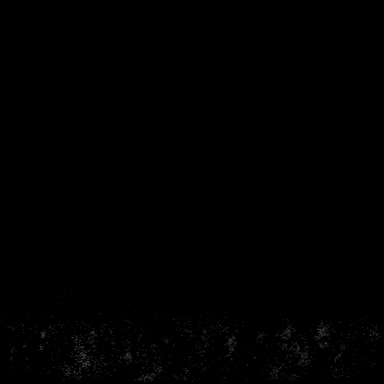
[im 36/144]
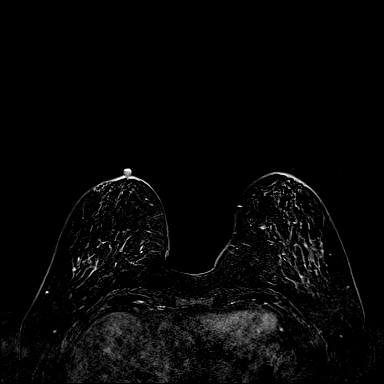
[im 72/144]
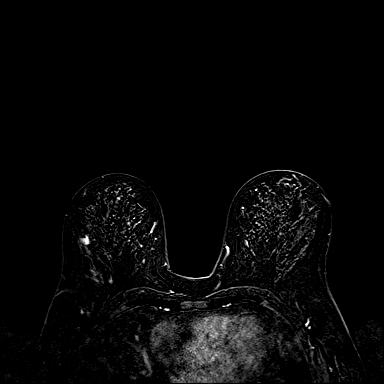
[im 108/144]
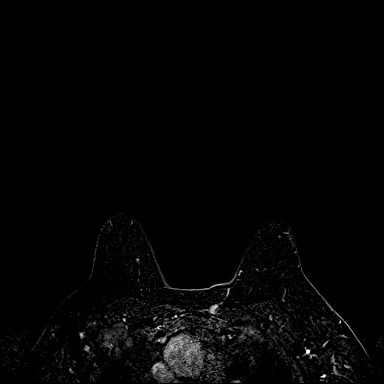
[im 144/144]
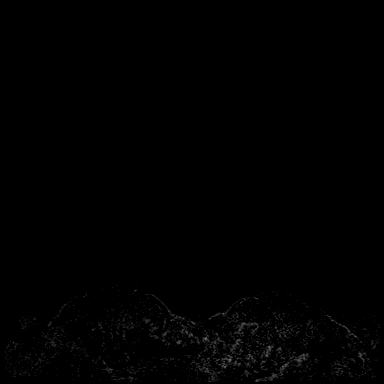

[32 of 48 positions shown; findings below may reference images not displayed]

Three-dimensional MR images were rendered by post-processing of the
original MR data on an independent workstation. The
three-dimensional MR images were interpreted, and findings are
reported in the following complete MRI report for this study. Three
dimensional images were evaluated at the independent [employer]
workstation
FINDINGS: Breast composition: c. Heterogeneous fibroglandular tissue.

Background parenchymal enhancement: Moderate.

Right breast: There is an enhancing mass in the right breast upper
outer quadrant measuring 9 x 6 x 7 mm. Mass is of intermediate to
slightly decreased T1 signal and heterogeneous but mostly increased
T2 signal. Enhancement shows wash in kinetics. No washout
characteristics.

No other suspicious masses or areas of abnormal enhancement.

Left breast: No mass or abnormal enhancement.

Lymph nodes: No abnormal appearing lymph nodes.

Ancillary findings:  None.
IMPRESSION: 1. 9 mm enhancing mass in the upper outer right breast. Although the
mass has nonaggressive enhancement kinetics and demonstrates
increased T2 signal, light of the patient's history, tissue sampling
is recommended.
2. No other masses or areas of abnormal enhancement.  No adenopathy.

RECOMMENDATION:
1. MRI guided core needle biopsy the 9 mm enhancing upper-outer
quadrant right breast mass.

BI-RADS CATEGORY  4: Suspicious.

## 2019-12-07 IMAGING — MR MR BREAST BX W LOC DEV 1ST LESION IMAGE BX SPEC MR GUIDE*R*
6 of 8 series · 33 of 48 positions shown · IV contrast (10 ml gadavist)
Comparison: Previous exams.

Addendum:
CLINICAL DATA: High risk screening MRI detected approximate 9 mm
mass involving the UPPER OUTER QUADRANT of the RIGHT breast.

EXAM:
MRI GUIDED CORE NEEDLE BIOPSY OF THE RIGHT BREAST
TECHNIQUE: Multiplanar, multisequence MR imaging of the RIGHT breast was
performed both before and after administration of intravenous
contrast.
CONTRAST:  6 mL Gadavist IV.

[Series 2: fiducial unilateral · sagittal · 2.0mm · 1.33mm/px · 1 of 52 slices shown]
[im 1/52]
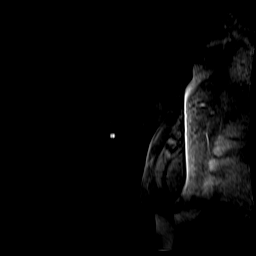

[Series 3: dynamic pre · axial · non-contrast · 1.3mm · 0.73mm/px · z∈[-76,+110]mm · 6 of 144 slices shown]
[im 1/144]
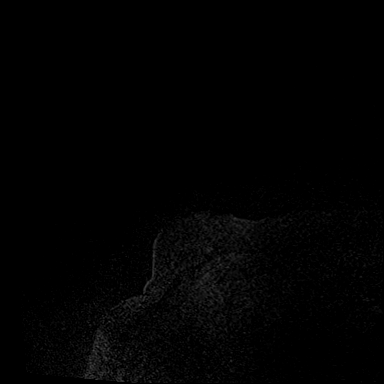
[im 29/144]
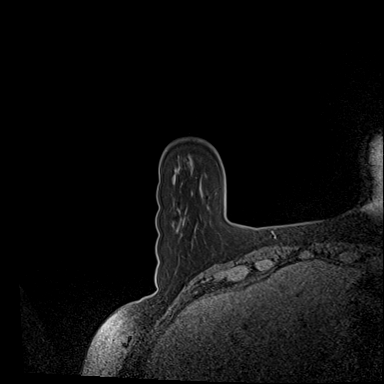
[im 58/144]
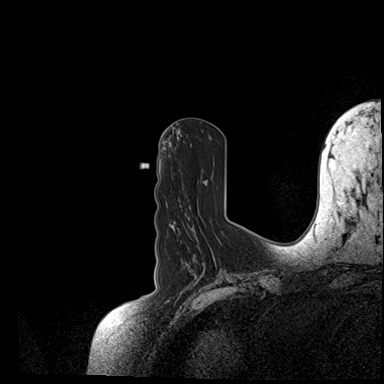
[im 86/144]
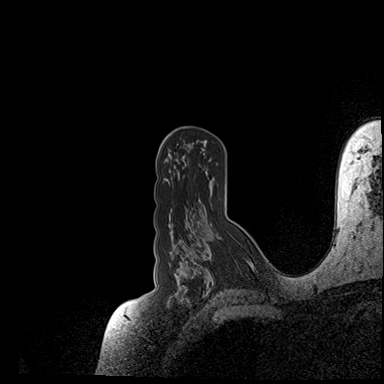
[im 115/144]
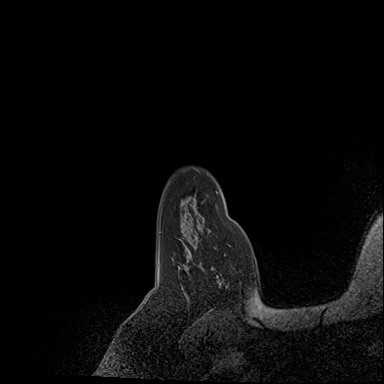
[im 144/144]
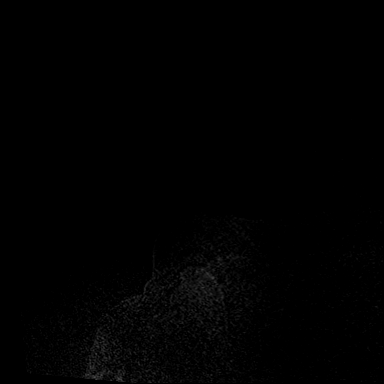

[Series 4: dynamic post 20 · axial · 1.3mm · 0.73mm/px · z∈[-76,+110]mm · 6 of 144 slices shown (1 of 2)]
[im 1/144]
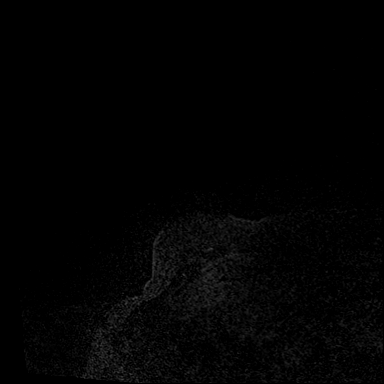
[im 29/144]
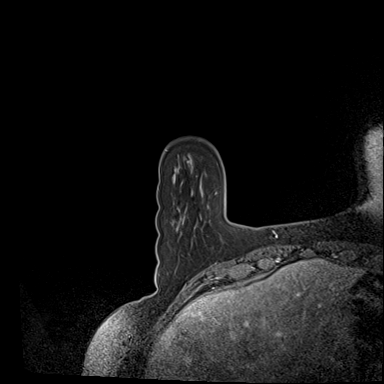
[im 58/144]
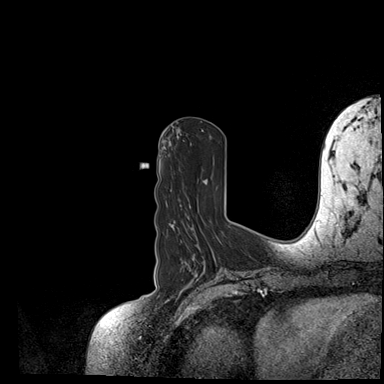
[im 86/144]
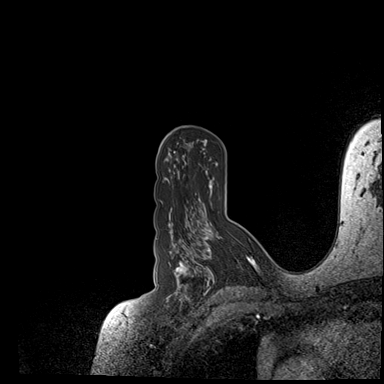
[im 115/144]
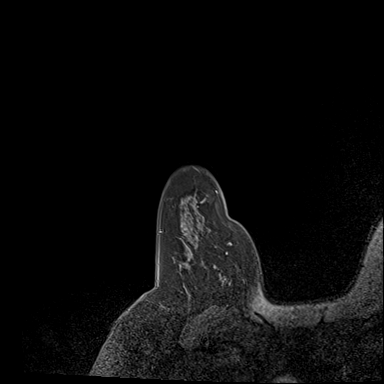
[im 144/144]
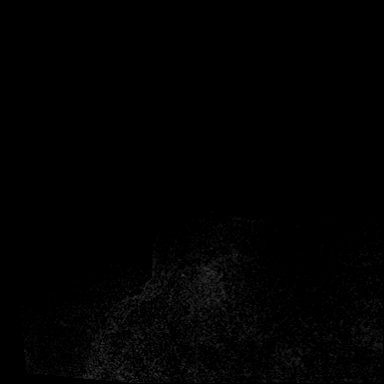

[Series 5: dynamic post 20 · axial · 1.3mm · 0.73mm/px · z∈[-76,+110]mm · 7 of 144 slices shown (2 of 2)]
[im 1/144]
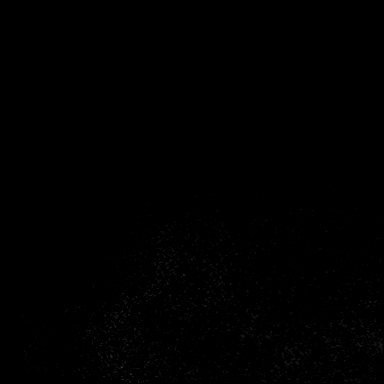
[im 24/144]
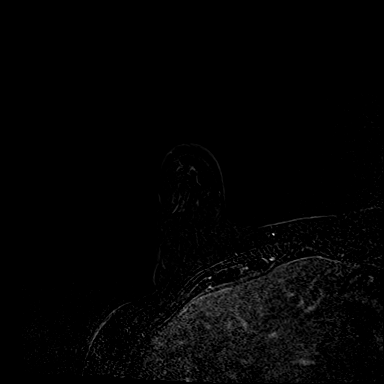
[im 48/144]
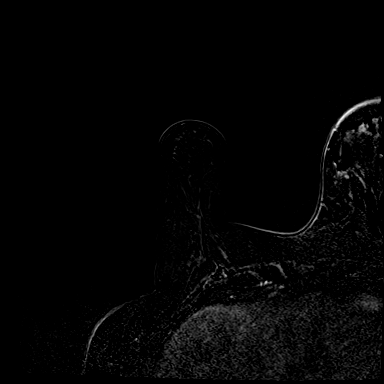
[im 72/144]
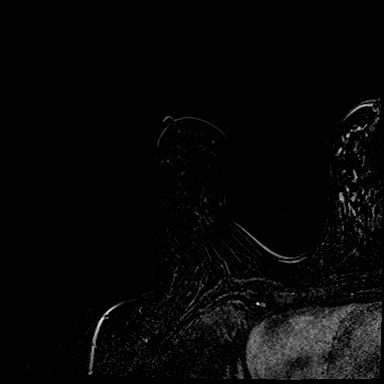
[im 96/144]
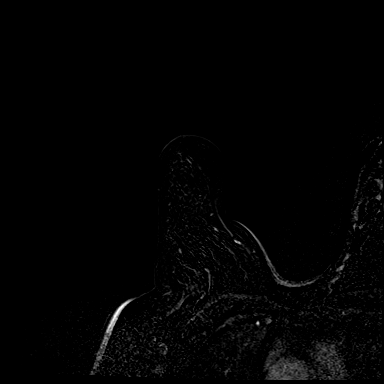
[im 120/144]
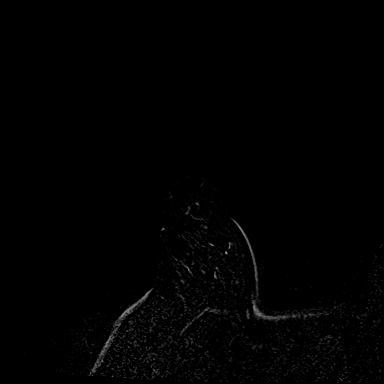
[im 144/144]
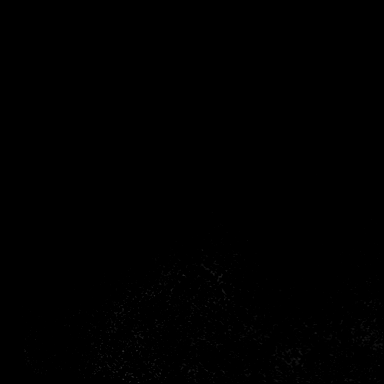

[Series 6: dynamic post 3 · axial · 1.3mm · 0.73mm/px · z∈[-76,+110]mm · 7 of 144 slices shown (1 of 2)]
[im 1/144]
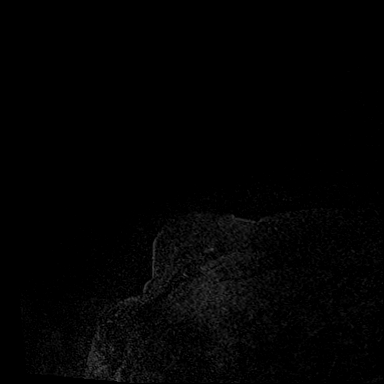
[im 24/144]
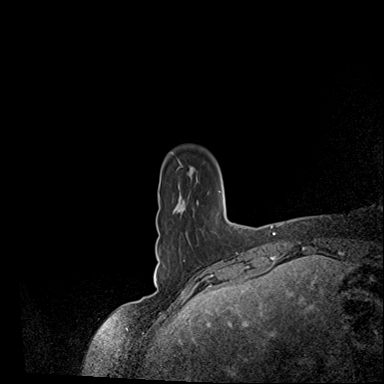
[im 48/144]
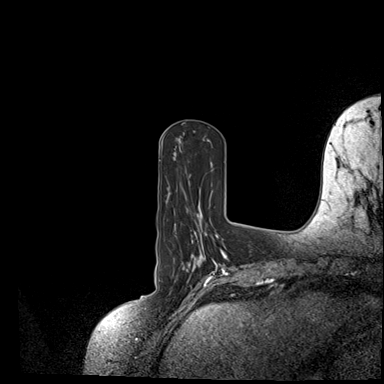
[im 72/144]
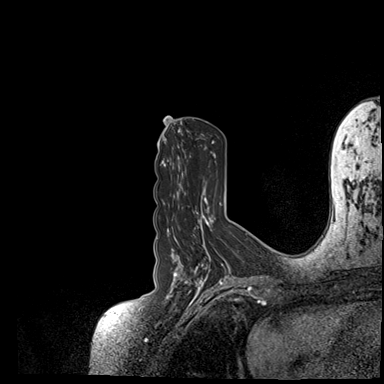
[im 96/144]
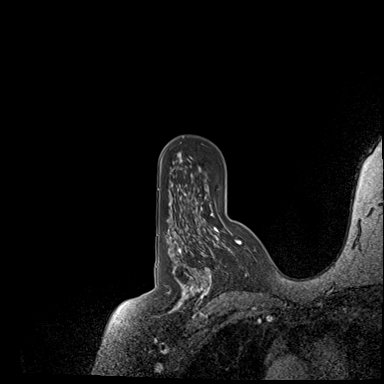
[im 120/144]
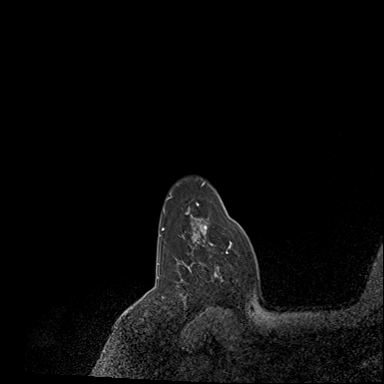
[im 144/144]
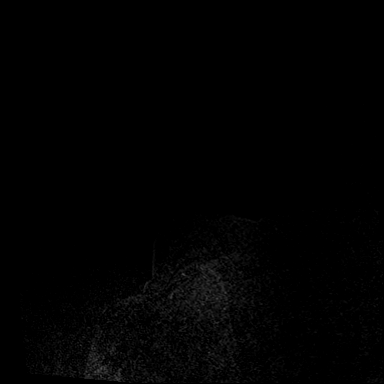

[Series 7: dynamic post 3 · axial · 1.3mm · 0.73mm/px · z∈[-76,+79]mm · 6 of 144 slices shown (2 of 2)]
[im 1/144]
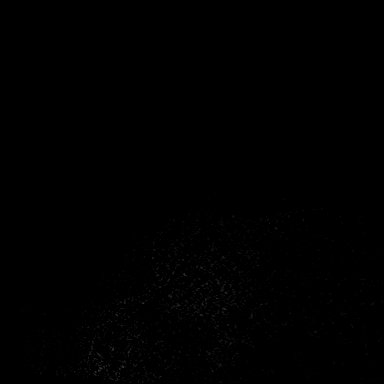
[im 24/144]
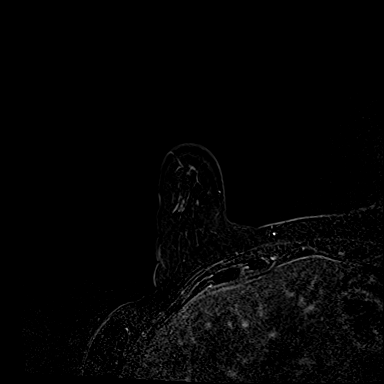
[im 48/144]
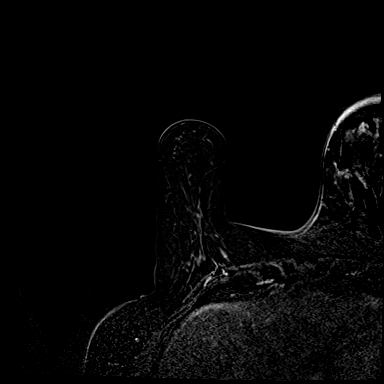
[im 72/144]
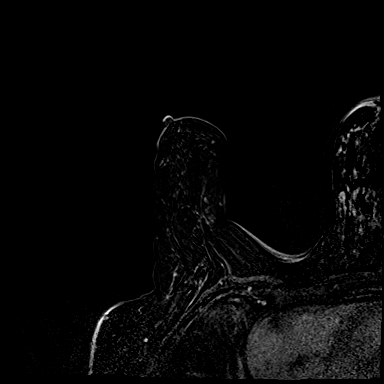
[im 96/144]
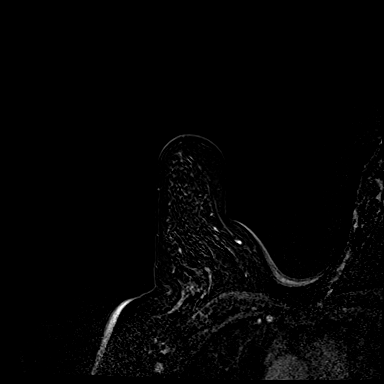
[im 120/144]
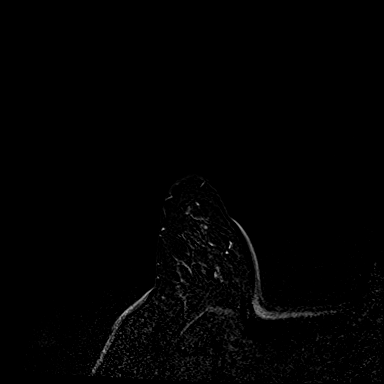

[33 of 48 positions shown; findings below may reference images not displayed]

FINDINGS: I met with the patient, and we discussed the procedure of MRI guided
biopsy, including risks, benefits, and alternatives. Specifically,
we discussed the risks of infection, bleeding, tissue injury, clip
migration, and inadequate sampling. Informed, written consent was
given. The usual time out protocol was performed immediately prior
to the procedure.

Using sterile technique with Betadine and alcohol as skin
antisepsis, 1% lidocaine and 1% lidocaine with epinephrine as local
anesthesia, using MRI guidance, a 9 gauge vacuum assisted device was
used to perform biopsy of the mass in the UPPER OUTER QUADRANT of
the RIGHT breast using a LATERAL approach. At the conclusion of the
procedure, a dumbbell-shaped tissue marker clip was deployed into
the biopsy cavity. Follow-up 2-view mammogram was performed and
dictated separately.
IMPRESSION: MRI guided biopsy of a mass involving the UPPER OUTER QUADRANT of
the RIGHT breast. No apparent complications.

ADDENDUM:
Pathology revealed PSEUDOANGIOMATOUS STROMAL HYPERPLASIA (PASH),
FOCAL FIBROADENOMATOID CHANGES of the Right breast, upper outer
quadrant. This was found to be concordant by Dr. Savio Locklear.

Pathology results were discussed with the patient by telephone. The
patient reported doing well after the biopsy with tenderness,
bruising and minimal bleeding at the site. Post biopsy instructions
and care were reviewed and questions were answered. The patient was
encouraged to call The [[facility]ACT[facility]] for any
additional concerns.

The patient was instructed to return for annual screening
annual high risk screening MRIs at [HOSPITAL].

Pathology results reported by Auntyjatty Delowr, RN on 07/10/2018.

*** End of Addendum ***

## 2019-12-07 IMAGING — MG MM CLIP PLACEMENT
2 series · 2 of 2 positions shown · non-contrast
Comparison: Previous exam(s).

CLINICAL DATA: Confirmation of clip placement after MRI guided
biopsy of a mass in the UPPER OUTER QUADRANT of the RIGHT breast.

EXAM:
DIAGNOSTIC RIGHT MAMMOGRAM POST MRI BIOPSY

[R ML]
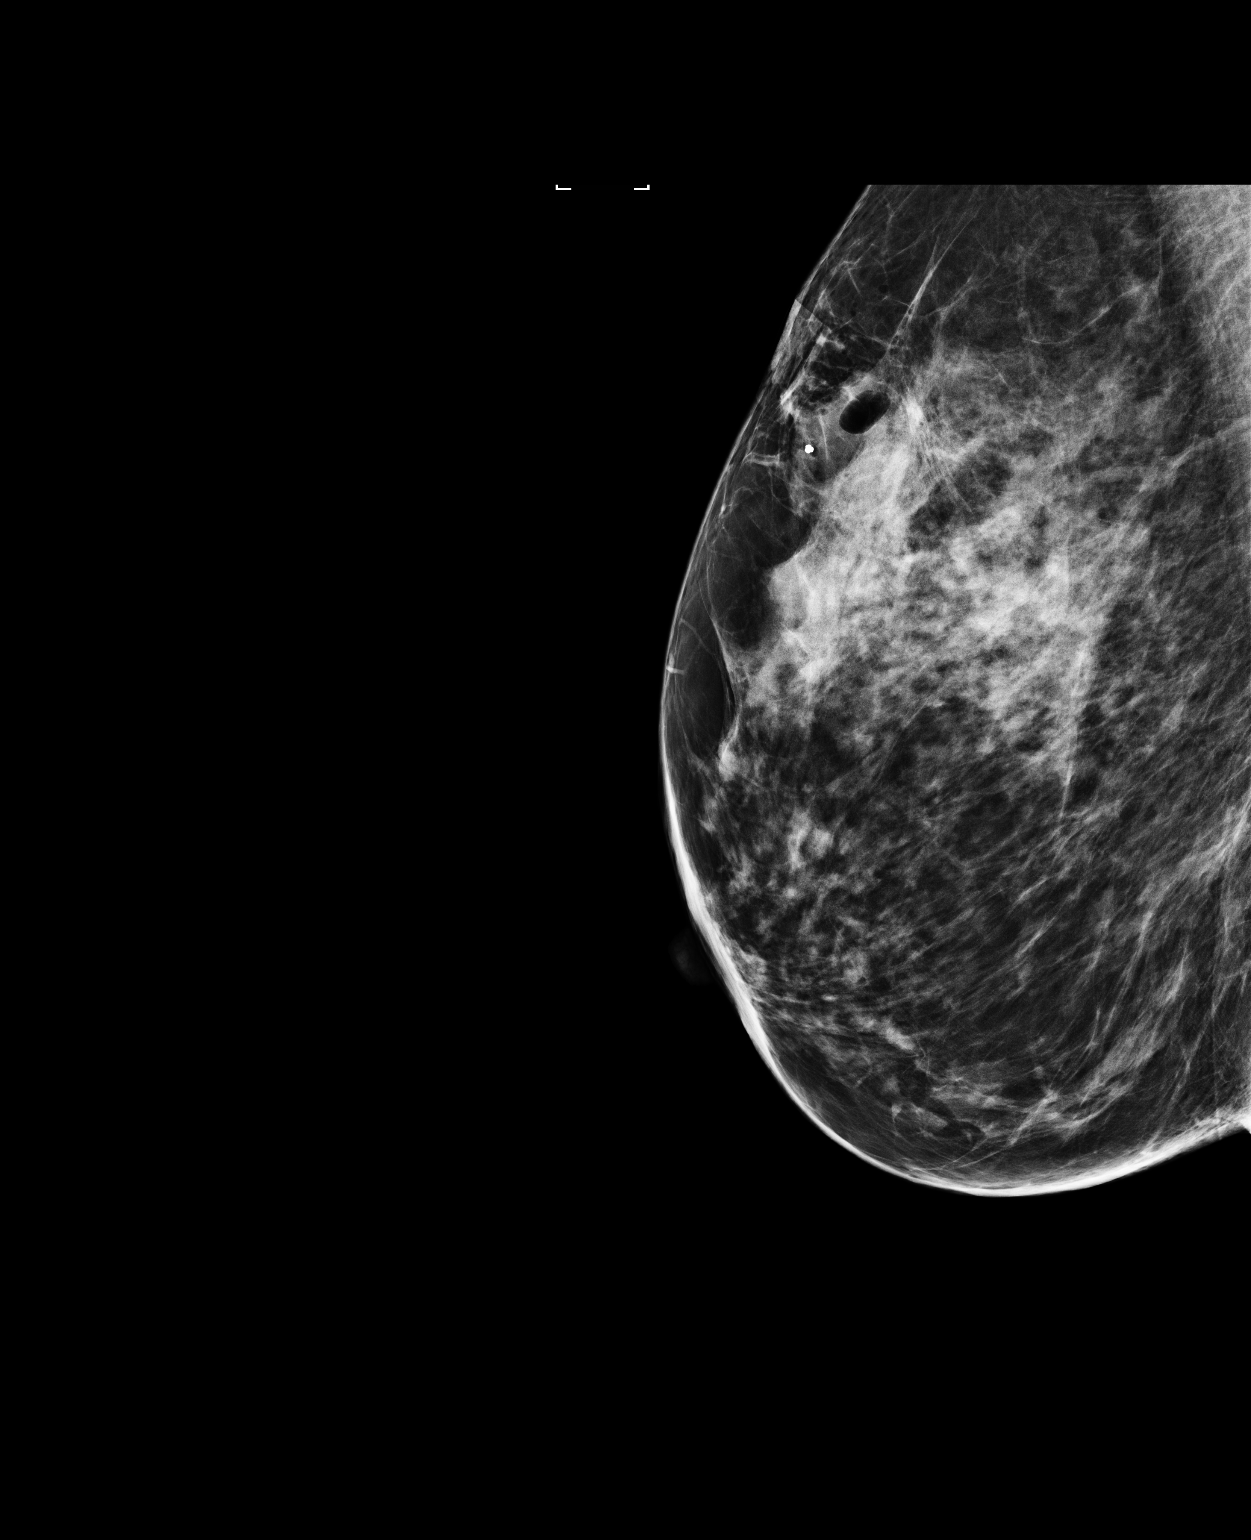

[R CC]
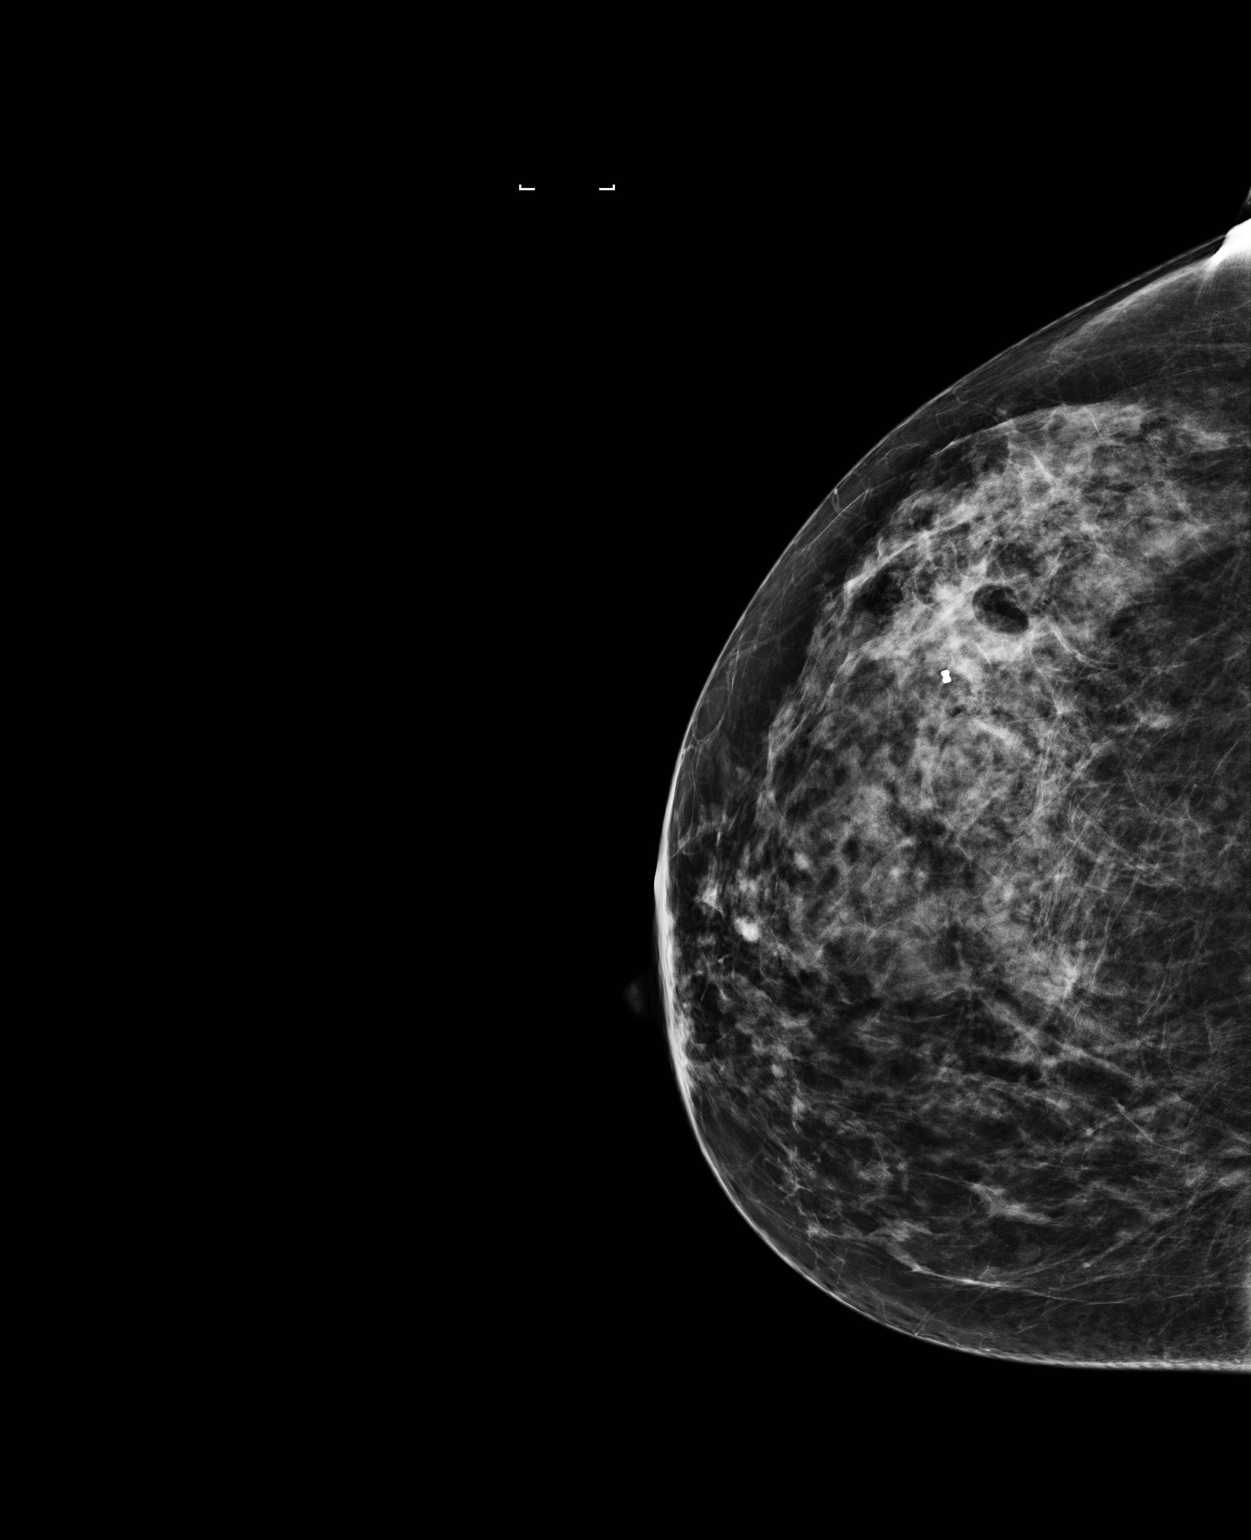

[2 of 2 positions shown; findings below may reference images not displayed]

FINDINGS: Mammographic images were obtained following MRI guided biopsy of a
mass in the UPPER OUTER QUADRANT of the RIGHT breast. The
dumbbell-shaped tissue marker clip is positioned within the UPPER
OUTER QUADRANT at MIDDLE depth at the site of the biopsied mass
which is mammographically occult.

A small low-density fluid collection is present at the biopsy site,
likely residual lavage fluid, less likely a small hematoma.
IMPRESSION: Appropriate positioning of the dumbbell-shaped tissue marker clip in
the UPPER OUTER QUADRANT of the RIGHT breast at MIDDLE depth at the
site of the biopsied mass which is mammographically occult.

Final Assessment: Post Procedure Mammograms for Marker Placement
